# Patient Record
Sex: Female | Born: 1955 | Race: Black or African American | Hispanic: No | Marital: Married | Smoking: Never smoker
Health system: Southern US, Community
[De-identification: ages and names within clinical notes are randomized; demographics above are authoritative.]

## PROBLEM LIST (undated history)

## (undated) DIAGNOSIS — H269 Unspecified cataract: Secondary | ICD-10-CM

## (undated) DIAGNOSIS — G5620 Lesion of ulnar nerve, unspecified upper limb: Secondary | ICD-10-CM

## (undated) DIAGNOSIS — G5600 Carpal tunnel syndrome, unspecified upper limb: Secondary | ICD-10-CM

## (undated) DIAGNOSIS — E1143 Type 2 diabetes mellitus with diabetic autonomic (poly)neuropathy: Secondary | ICD-10-CM

## (undated) DIAGNOSIS — E1149 Type 2 diabetes mellitus with other diabetic neurological complication: Secondary | ICD-10-CM

## (undated) DIAGNOSIS — E559 Vitamin D deficiency, unspecified: Secondary | ICD-10-CM

## (undated) DIAGNOSIS — B029 Zoster without complications: Secondary | ICD-10-CM

## (undated) DIAGNOSIS — E039 Hypothyroidism, unspecified: Secondary | ICD-10-CM

## (undated) HISTORY — DX: Unspecified cataract: H26.9

## (undated) HISTORY — DX: Zoster without complications: B02.9

## (undated) HISTORY — DX: Type 2 diabetes mellitus with diabetic autonomic (poly)neuropathy: E11.43

## (undated) HISTORY — DX: Lesion of ulnar nerve, unspecified upper limb: G56.20

## (undated) HISTORY — DX: Vitamin D deficiency, unspecified: E55.9

## (undated) HISTORY — DX: Type 2 diabetes mellitus with other diabetic neurological complication: E11.49

## (undated) HISTORY — DX: Hypothyroidism, unspecified: E03.9

## (undated) HISTORY — PX: TONSILLECTOMY: SUR1361

## (undated) HISTORY — DX: Carpal tunnel syndrome, unspecified upper limb: G56.00

---

## 2011-01-06 LAB — CBC AND DIFFERENTIAL
HEMATOCRIT: 42 % (ref 36–46)
HEMOGLOBIN: 13.9 g/dL (ref 12.0–16.0)
PLATELETS: 272 10*3/uL (ref 150–399)
WBC: 4.2 10^3/mL

## 2011-01-06 LAB — LIPID PANEL
Cholesterol: 206 mg/dL — AB (ref 0–200)
HDL: 77 mg/dL — AB (ref 35–70)
LDL CALC: 111 mg/dL
Triglycerides: 90 mg/dL (ref 40–160)

## 2011-01-06 LAB — HEMOGLOBIN A1C: Hgb A1c MFr Bld: 6.1 % — AB (ref 4.0–6.0)

## 2011-01-06 LAB — TSH: TSH: 1.37 u[IU]/mL (ref <=5.90)

## 2015-01-05 ENCOUNTER — Other Ambulatory Visit: Payer: Self-pay

## 2015-01-05 DIAGNOSIS — Z1231 Encounter for screening mammogram for malignant neoplasm of breast: Secondary | ICD-10-CM

## 2015-01-11 ENCOUNTER — Ambulatory Visit
Admission: RE | Admit: 2015-01-11 | Discharge: 2015-01-11 | Disposition: A | Discharge status: Home / Self Care | Payer: Federal, State, Local not specified - PPO | Source: Ambulatory Visit

## 2015-01-11 DIAGNOSIS — Z1231 Encounter for screening mammogram for malignant neoplasm of breast: Secondary | ICD-10-CM

## 2015-01-30 LAB — HEPATIC FUNCTION PANEL
ALK PHOS: 56 U/L (ref 25–125)
ALT: 15 U/L (ref 7–35)
AST: 19 U/L (ref 13–35)
Bilirubin, Total: 0.6 mg/dL

## 2015-01-30 LAB — TSH: TSH: 1.12 u[IU]/mL (ref 0.41–5.90)

## 2015-01-30 LAB — CBC AND DIFFERENTIAL
HCT: 41 % (ref 36–46)
HEMOGLOBIN: 13.1 g/dL (ref 12.0–16.0)
Neutrophils Absolute: 2582 /uL
Platelets: 279 10*3/uL (ref 150–399)
WBC: 4.9 10^3/mL

## 2015-01-30 LAB — HEMOGLOBIN A1C: Hgb A1c MFr Bld: 6.2 % — AB (ref 4.0–6.0)

## 2015-01-30 LAB — LIPID PANEL
Cholesterol: 228 mg/dL — AB (ref 0–200)
HDL: 74 mg/dL — AB (ref 35–70)
LDL Cholesterol: 129 mg/dL
Triglycerides: 124 mg/dL (ref 40–160)

## 2015-02-15 ENCOUNTER — Ambulatory Visit: Payer: Self-pay | Admitting: Internal Medicine

## 2015-05-07 ENCOUNTER — Encounter: Payer: Self-pay | Admitting: *Deleted

## 2015-05-07 ENCOUNTER — Encounter: Payer: Self-pay | Admitting: Internal Medicine

## 2015-05-07 ENCOUNTER — Ambulatory Visit (INDEPENDENT_AMBULATORY_CARE_PROVIDER_SITE_OTHER): Payer: Federal, State, Local not specified - PPO | Admitting: Internal Medicine

## 2015-05-07 ENCOUNTER — Other Ambulatory Visit: Payer: Self-pay | Admitting: *Deleted

## 2015-05-07 VITALS — BP 110/80 | HR 77 | Temp 98.5°F | Resp 18 | Ht 66.14 in | Wt 153.2 lb

## 2015-05-07 DIAGNOSIS — E1149 Type 2 diabetes mellitus with other diabetic neurological complication: Secondary | ICD-10-CM | POA: Diagnosis not present

## 2015-05-07 DIAGNOSIS — G629 Polyneuropathy, unspecified: Secondary | ICD-10-CM

## 2015-05-07 DIAGNOSIS — E1142 Type 2 diabetes mellitus with diabetic polyneuropathy: Secondary | ICD-10-CM | POA: Diagnosis not present

## 2015-05-07 DIAGNOSIS — R311 Benign essential microscopic hematuria: Secondary | ICD-10-CM

## 2015-05-07 DIAGNOSIS — Z8639 Personal history of other endocrine, nutritional and metabolic disease: Secondary | ICD-10-CM | POA: Diagnosis not present

## 2015-05-07 DIAGNOSIS — H269 Unspecified cataract: Secondary | ICD-10-CM | POA: Diagnosis not present

## 2015-05-07 DIAGNOSIS — D1802 Hemangioma of intracranial structures: Secondary | ICD-10-CM | POA: Diagnosis not present

## 2015-05-07 DIAGNOSIS — Z1211 Encounter for screening for malignant neoplasm of colon: Secondary | ICD-10-CM | POA: Diagnosis not present

## 2015-05-07 NOTE — Progress Notes (Signed)
Patient ID: Caitlin Hunt, female   DOB: 31-Jan-1956, 59 y.o.   MRN: [id number]   Location:  [facility] / [facility]  Code Status: full code Goals of Care: Advanced Directive information Does patient have an advance directive?: No, Would patient like information on creating an advanced directive?: Yes - Educational materials given   Chief Complaint  Patient presents with  . Establish Care    new patient  . Medical Management of Chronic Issues    HPI: Patient is a 59 y.o. black female seen in the office today to establish with the practice and for medical management of her chronic diseases.  Her mother is my patient, as well and this is how she found out about [place].  She was seeing Dr. Rainey Pines.  She did not have a PCP in [place].    Has a fear of losing her toes b/c of her neuropathy.  Has had numbness for a couple of years, but not painful until recently.  Feels like this is worsening.  She is trying to control her DM with diet.  Has lost weight.  Doesn't want to be in a wheelchair or lose toes.  Her father died in a diabetic coma 9 years ago.  Pain is manageable when she doesn't overdo it.  When travels 7 hrs one way into [place] weekly.  Has gotten better when she does not travel.  Gabapentin works.  Is doing foot exercises doing alphabet and that is helping.  Also takes her omega 3 and vitamin D.  Does not check CBGs.  Was diagnosed approximately 3 years ago.  Got diagnosed with the neuropathy at the same time.  Had last labs 4 mos ago.  Also has a patch for pain.  Last eye appt was 8 mos ago in [place].    Records from neurology reviewed which showed h/o ulnar neuropathy and carpal tunnel syndrome, as well.    Had shingles 6 wks after she got married.  No postherpetic neuralgia in chest area, but it was very painful.  Hypothyroidism has been normal for the past 10 years.  Had hematuria at one time, nonpainful, has been microscopic.  Years ago she had headaches,  had MRI.  Was 2009 with venous angioma present.  No longer has the headaches.  Had her mammogram 4 mos. ago. Has never had cscope and needs that. Has never had a flu shot. Says she only had the flu once.   Has a personal preference against vaccines. Has never had pneumovax or prevnar Does not know when she had tdap last.   Has been taking care of her mother for 5 years.   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  She thinks she does have depression.  Her mother has it.  Only her mother saw psychiatry.  Being the anti-pill people, she does not want cymbalta and other meds.  Lives with it through her faith.    Review of Systems:  Review of Systems  Constitutional: Positive for weight loss.  HENT: Negative for hearing loss.        Partial plate  Eyes:       Corrective lenses; eyes getting worse each year--has a cataract that is not ripe; cannot see well at night  Respiratory: Negative for shortness of breath.   Cardiovascular: Positive for palpitations. Negative for chest pain.       When takes mvi  Gastrointestinal: Positive for constipation.       Eats a lot of salad--admits not  enough water as she should  Genitourinary: Positive for hematuria.       Microscopic  Musculoskeletal: Negative for myalgias, joint pain and falls.  Neurological: Positive for tingling and sensory change. Negative for headaches.  Endo/Heme/Allergies:       Diabetes  Psychiatric/Behavioral: Positive for depression. Negative for memory loss. The patient is nervous/anxious and has insomnia.        Stress; wakes up in the middle of the night at times--is better than 2-3 yrs ago--attributed to menopause; says she forgets more now and writes things down    Past Medical History  Diagnosis Date  . DM (diabetes mellitus) type II controlled, neurological manifestation   . Peripheral autonomic neuropathy due to diabetes mellitus   . Hypothyroidism   . Herpes zoster     chest area  . Vitamin D deficiency   . Carpal tunnel syndrome     . Ulnar neuropathy     Past Surgical History  Procedure Laterality Date  . Tonsillectomy     History   Social History  . Marital Status: Married    Spouse Name: N/A  . Number of Children: N/A  . Years of Education: N/A   Occupational History  . Not on file.   Social History Main Topics  . Smoking status: Never Smoker   . Smokeless tobacco: Not on file  . Alcohol Use: 1.2 oz/week    2 Standard drinks or equivalent per week     Comment: wine  . Drug Use: No  . Sexual Activity: Not on file   Other Topics Concern  . Not on file   Social History Narrative   Diet- Low calorie   Caffeine-No    Married- yes, 2007   House- N/A, 2 persons in home   Pets- 2 dogs   Current/Past profession- Tourist information centre manager   Exercise- daily, 45 min   Living Will-No   DNR-No    POA/HPOA- No          Family History  Problem Relation Age of Onset  . Diabetes Father   . Depression Mother   . Heart disease Mother   . Neuropathy Brother   . Neuropathy Son   . High blood pressure Brother   . Heart attack    . Stroke Mother   . Arthritis Mother   . Osteoporosis Mother    Allergies  Allergen Reactions  . Codeine Nausea And Vomiting  . Other     Allergy to mussels   Medications: Patient's Medications  New Prescriptions   No medications on file  Previous Medications   GABAPENTIN (NEURONTIN) 300 MG CAPSULE    Take 300 mg by mouth 2 (two) times daily.   NONFORMULARY OR COMPOUNDED ITEM    Apply topically.   OMEGA-3 FATTY ACIDS (OMEGA 3 PO)    Take 2 capsules by mouth daily.    PYRIDOXINE (VITAMIN B-6) 100 MG TABLET    Take 100 mg by mouth daily.   VITAMIN D, ERGOCALCIFEROL, (DRISDOL) 50000 UNITS CAPS CAPSULE    Take 50,000 Units by mouth 2 (two) times a week. For 90 days  Modified Medications   No medications on file  Discontinued Medications   No medications on file    Physical Exam: Filed Vitals:   05/07/15 0900  BP: 110/80  Pulse: 77  Temp: 98.5 F  (36.9 C)  TempSrc: Oral  Resp: 18  Height: 5' 6.14" (1.68 m)  Weight: 153 lb 3.2 oz (69.491 kg)  SpO2:  97%   Physical Exam  Constitutional: She is oriented to person, place, and time. She appears well-developed and well-nourished. No distress.  Cardiovascular: Normal rate, regular rhythm, normal heart sounds and intact distal pulses.   Pulmonary/Chest: Effort normal and breath sounds normal.  Abdominal: Bowel sounds are normal.  Musculoskeletal: Normal range of motion.  Neurological: She is alert and oriented to person, place, and time.  Skin: Skin is warm and dry.  Psychiatric: She has a normal mood and affect. Her behavior is normal. Judgment and thought content normal.   Labs reviewed: 01/09/11 labs--will get abstracted.    Procedures reviewed:   Mammogram 01/11/15 NCV and EMG 02/08/15 MRI brain 08/25/08:  Moderate sized venous angioma of right frontal lobe  Assessment/Plan 1. DM (diabetes mellitus) type II controlled, neurological manifestation - tells me her glucose average has been recently well controlled -await records from PCP that have been requested  - Ambulatory referral to Ophthalmology to establish with [facility] here -needs routine labs--last she says were 4 mos ago - CBC with Differential/Platelet - Comprehensive metabolic panel - Hemoglobin A1c - Lipid panel - Microalbumin/Creatinine Ratio, Urine -check foot exam next time  2. Diabetic peripheral neuropathy associated with type 2 diabetes mellitus - cont diet and exercise (45 mins per day) -cont gabapentin and compounded topical, supplements - Microalbumin/Creatinine Ratio, Urine  3. Benign microscopic hematuria -historical per pt -requests recheck - CBC with Differential/Platelet - Urinalysis with Reflex Microscopic  4. Venous angioma of brain -seen on MRI in 2009 when she was having headaches -she would like this reassessed in the near future which I don't think is a bad idea--will plan to order at  her next appt - CBC with Differential/Platelet  5. History of hypothyroidism - has had normal tsh, free t3, free t4 for several years by history - TSH  6. Cataract - is not yet ripe she was told -refer to Dr. Gershon Crane, et al for evaluation and treatment and diabetic eye exam - Ambulatory referral to Ophthalmology  7. Colon cancer screening - has never had colonoscopy - Ambulatory referral to Gastroenterology  Records were requested from PCP, as well.  Reviewed neuro records and labs.   Labs/tests ordered:   Orders Placed This Encounter  Procedures  . CBC with Differential/Platelet  . Comprehensive metabolic panel    Order Specific Question:  Has the patient fasted?    Answer:  Yes  . Hemoglobin A1c  . Lipid panel    Order Specific Question:  Has the patient fasted?    Answer:  Yes  . TSH  . Urinalysis with Reflex Microscopic  . Microalbumin/Creatinine Ratio, Urine  . Ambulatory referral to Gastroenterology    Referral Priority:  Routine    Referral Type:  Consultation    Referral Reason:  Specialty Services Required    Number of Visits Requested:  1  . Ambulatory referral to Ophthalmology    Referral Priority:  Routine    Referral Type:  Consultation    Referral Reason:  Specialty Services Required    Requested Specialty:  Ophthalmology    Number of Visits Requested:  1   Next appt:  4 mos for med mgt with diabetic foot exam and plan to order MRI brain  Caitlin Hunt, D.O. [facility] [address], [place] [address] Cell Phone (Mon-Fri 8am-5pm):  [phone] On Call:  [phone] & follow prompts after 5pm & weekends Office Phone:  [phone] Office Fax:  [phone]

## 2015-05-08 LAB — COMPREHENSIVE METABOLIC PANEL
ALT: 23 IU/L (ref 0–32)
AST: 26 IU/L (ref 0–40)
Albumin/Globulin Ratio: 1.8 (ref 1.1–2.5)
Albumin: 4.6 g/dL (ref 3.6–4.8)
Alkaline Phosphatase: 57 IU/L (ref 39–117)
BUN/Creatinine Ratio: 26 (ref 11–26)
BUN: 19 mg/dL (ref 8–27)
Bilirubin Total: 0.8 mg/dL (ref 0.0–1.2)
CO2: 22 mmol/L (ref 18–29)
Calcium: 10 mg/dL (ref 8.7–10.3)
Chloride: 102 mmol/L (ref 97–108)
Creatinine, Ser: 0.72 mg/dL (ref 0.57–1.00)
GFR calc Af Amer: 105 mL/min/{1.73_m2} (ref >59)
GFR calc non Af Amer: 91 mL/min/{1.73_m2} (ref >59)
Globulin, Total: 2.6 g/dL (ref 1.5–4.5)
Glucose: 88 mg/dL (ref 65–99)
Potassium: 4.4 mmol/L (ref 3.5–5.2)
Sodium: 142 mmol/L (ref 134–144)
Total Protein: 7.2 g/dL (ref 6.0–8.5)

## 2015-05-08 LAB — CBC WITH DIFFERENTIAL/PLATELET
Basophils Absolute: 0 10*3/uL (ref 0.0–0.2)
Basos: 0 %
EOS (ABSOLUTE): 0.1 10*3/uL (ref 0.0–0.4)
Eos: 1 %
Hematocrit: 39.9 % (ref 34.0–46.6)
Hemoglobin: 13.2 g/dL (ref 11.1–15.9)
Immature Grans (Abs): 0 10*3/uL (ref 0.0–0.1)
Immature Granulocytes: 0 %
Lymphocytes Absolute: 1.9 10*3/uL (ref 0.7–3.1)
Lymphs: 44 %
MCH: 29.3 pg (ref 26.6–33.0)
MCHC: 33.1 g/dL (ref 31.5–35.7)
MCV: 89 fL (ref 79–97)
Monocytes Absolute: 0.3 10*3/uL (ref 0.1–0.9)
Monocytes: 8 %
Neutrophils Absolute: 2 10*3/uL (ref 1.4–7.0)
Neutrophils: 47 %
Platelets: 293 10*3/uL (ref 150–379)
RBC: 4.51 x10E6/uL (ref 3.77–5.28)
RDW: 15.3 % (ref 12.3–15.4)
WBC: 4.2 10*3/uL (ref 3.4–10.8)

## 2015-05-08 LAB — MICROSCOPIC EXAMINATION
Casts: NONE SEEN /lpf
Epithelial Cells (non renal): >10 /hpf — AB (ref 0–10)

## 2015-05-08 LAB — URINALYSIS, ROUTINE W REFLEX MICROSCOPIC
Bilirubin, UA: NEGATIVE
Glucose, UA: NEGATIVE
Ketones, UA: NEGATIVE
Leukocytes, UA: NEGATIVE
Nitrite, UA: NEGATIVE
Protein, UA: NEGATIVE
Specific Gravity, UA: 1.022 (ref 1.005–1.030)
Urobilinogen, Ur: 1 mg/dL (ref 0.2–1.0)
pH, UA: 5.5 (ref 5.0–7.5)

## 2015-05-08 LAB — LIPID PANEL
Chol/HDL Ratio: 2.3 ratio units (ref 0.0–4.4)
Cholesterol, Total: 209 mg/dL — ABNORMAL HIGH (ref 100–199)
HDL: 90 mg/dL (ref >39)
LDL Calculated: 107 mg/dL — ABNORMAL HIGH (ref 0–99)
Triglycerides: 59 mg/dL (ref 0–149)
VLDL Cholesterol Cal: 12 mg/dL (ref 5–40)

## 2015-05-08 LAB — HEMOGLOBIN A1C
Est. average glucose Bld gHb Est-mCnc: 126 mg/dL
Hgb A1c MFr Bld: 6 % — ABNORMAL HIGH (ref 4.8–5.6)

## 2015-05-08 LAB — MICROALBUMIN / CREATININE URINE RATIO
Creatinine, Urine: 137.7 mg/dL
MICROALB/CREAT RATIO: 2.3 mg/g creat (ref 0.0–30.0)
Microalbumin, Urine: 3.2 ug/mL

## 2015-05-08 LAB — TSH: TSH: 0.983 u[IU]/mL (ref 0.450–4.500)

## 2015-05-09 ENCOUNTER — Encounter: Payer: Self-pay | Admitting: Internal Medicine

## 2015-05-14 ENCOUNTER — Telehealth: Payer: Self-pay

## 2015-05-14 NOTE — Telephone Encounter (Signed)
Received phone call from Dr. Ronal Fear , Neurologist office in [place]. Phone [phone]; fax [phone]. Patient is there today told them that Dr. Mariea Clonts did lab work on 05/07/15 could we fax copy to them. They will put Dr. Mariea Clonts down as her PCP. Printed labs and faxed.

## 2015-05-17 ENCOUNTER — Other Ambulatory Visit: Payer: Federal, State, Local not specified - PPO

## 2015-05-17 ENCOUNTER — Other Ambulatory Visit: Payer: Self-pay

## 2015-05-17 DIAGNOSIS — E538 Deficiency of other specified B group vitamins: Secondary | ICD-10-CM

## 2015-05-17 DIAGNOSIS — E559 Vitamin D deficiency, unspecified: Secondary | ICD-10-CM

## 2015-05-18 LAB — VITAMIN B12: Vitamin B-12: 596 pg/mL (ref 211–946)

## 2015-05-18 LAB — VITAMIN D 25 HYDROXY (VIT D DEFICIENCY, FRACTURES): Vit D, 25-Hydroxy: 36.8 ng/mL (ref 30.0–100.0)

## 2015-05-28 ENCOUNTER — Encounter: Payer: Self-pay | Admitting: Gastroenterology

## 2015-05-31 ENCOUNTER — Telehealth: Payer: Self-pay

## 2015-05-31 NOTE — Telephone Encounter (Signed)
That's awful.  I recommend she monitor her stools for blood and pay attention to any new abdominal pain she may develop.  If she has any new pain or blood, she should go to the ED for it.  It would take anywhere from 1-3 days typically to pass something.

## 2015-05-31 NOTE — Telephone Encounter (Signed)
Spoke with patient, patient again confirmed no visible blood. Patient denied abdominal pain as well. Patient with diarrhea but other than that doing ok. This incident occurred 2 days ago. Patient verbalized understanding of Dr.Reed's instructions/recommendations

## 2015-05-31 NOTE — Telephone Encounter (Signed)
Message left on triage voicemail- patient states she ate a piece of glass in a salad and would like to know if Dr.Reed would like for her to have her stool examined. Patient collected a bowel movement just in case. Patient with cur in her mouth from eating glass. Patient has not noticed any visible blood in stool.  Please advise

## 2015-06-26 ENCOUNTER — Encounter: Payer: Self-pay | Admitting: Internal Medicine

## 2015-07-27 ENCOUNTER — Ambulatory Visit (AMBULATORY_SURGERY_CENTER): Payer: Self-pay

## 2015-07-27 VITALS — Ht 67.0 in | Wt 159.0 lb

## 2015-07-27 DIAGNOSIS — Z1211 Encounter for screening for malignant neoplasm of colon: Secondary | ICD-10-CM

## 2015-07-27 MED ORDER — NA SULFATE-K SULFATE-MG SULF 17.5-3.13-1.6 GM/177ML PO SOLN: 1.0000 | Freq: Once | ORAL | Status: DC

## 2015-07-27 NOTE — Progress Notes (Signed)
No egg or soy allergies Not on home 02 No previous anesthesia complications Emmi video declined. No diet or weight loss meds 

## 2015-08-02 ENCOUNTER — Encounter: Payer: Self-pay | Admitting: Gastroenterology

## 2015-08-02 ENCOUNTER — Ambulatory Visit (AMBULATORY_SURGERY_CENTER): Payer: Federal, State, Local not specified - PPO | Admitting: Gastroenterology

## 2015-08-02 VITALS — BP 101/62 | HR 90 | Temp 97.7°F | Resp 16 | Ht 67.0 in | Wt 159.0 lb

## 2015-08-02 DIAGNOSIS — D123 Benign neoplasm of transverse colon: Secondary | ICD-10-CM

## 2015-08-02 DIAGNOSIS — Z1211 Encounter for screening for malignant neoplasm of colon: Secondary | ICD-10-CM

## 2015-08-02 DIAGNOSIS — D122 Benign neoplasm of ascending colon: Secondary | ICD-10-CM

## 2015-08-02 DIAGNOSIS — K635 Polyp of colon: Secondary | ICD-10-CM | POA: Diagnosis not present

## 2015-08-02 MED ORDER — SODIUM CHLORIDE 0.9 % IV SOLN: 500.0000 mL | INTRAVENOUS | Status: DC

## 2015-08-02 NOTE — Progress Notes (Signed)
Called to room to assist during endoscopic procedure.  Patient ID and intended procedure confirmed with present staff. Received instructions for my participation in the procedure from the performing physician.  

## 2015-08-02 NOTE — Patient Instructions (Signed)
Impressions/recommendations:  Polyps (handout given) Diverticulosis (handout given) High Fiber Diet (handout given)  Repeat colonoscopy pending pathology results.  YOU HAD AN ENDOSCOPIC PROCEDURE TODAY AT [facility]:   Refer to the procedure report that was given to you for any specific questions about what was found during the examination.  If the procedure report does not answer your questions, please call your gastroenterologist to clarify.  If you requested that your care partner not be given the details of your procedure findings, then the procedure report has been included in a sealed envelope for you to review at your convenience later.  YOU SHOULD EXPECT: Some feelings of bloating in the abdomen. Passage of more gas than usual.  Walking can help get rid of the air that was put into your GI tract during the procedure and reduce the bloating. If you had a lower endoscopy (such as a colonoscopy or flexible sigmoidoscopy) you may notice spotting of blood in your stool or on the toilet paper. If you underwent a bowel prep for your procedure, you may not have a normal bowel movement for a few days.  Please Note:  You might notice some irritation and congestion in your nose or some drainage.  This is from the oxygen used during your procedure.  There is no need for concern and it should clear up in a day or so.  SYMPTOMS TO REPORT IMMEDIATELY:   Following lower endoscopy (colonoscopy or flexible sigmoidoscopy):  Excessive amounts of blood in the stool  Significant tenderness or worsening of abdominal pains  Swelling of the abdomen that is new, acute  Fever of 100F or higher  For urgent or emergent issues, a gastroenterologist can be reached at any hour by calling [phone].   DIET: Your first meal following the procedure should be a small meal and then it is ok to progress to your normal diet. Heavy or fried foods are harder to digest and may make you feel nauseous  or bloated.  Likewise, meals heavy in dairy and vegetables can increase bloating.  Drink plenty of fluids but you should avoid alcoholic beverages for 24 hours.  ACTIVITY:  You should plan to take it easy for the rest of today and you should NOT DRIVE or use heavy machinery until tomorrow (because of the sedation medicines used during the test).    FOLLOW UP: Our staff will call the number listed on your records the next business day following your procedure to check on you and address any questions or concerns that you may have regarding the information given to you following your procedure. If we do not reach you, we will leave a message.  However, if you are feeling well and you are not experiencing any problems, there is no need to return our call.  We will assume that you have returned to your regular daily activities without incident.  If any biopsies were taken you will be contacted by phone or by letter within the next 1-3 weeks.  Please call us at [phone] if you have not heard about the biopsies in 3 weeks.    SIGNATURES/CONFIDENTIALITY: You and/or your care partner have signed paperwork which will be entered into your electronic medical record.  These signatures attest to the fact that that the information above on your After Visit Summary has been reviewed and is understood.  Full responsibility of the confidentiality of this discharge information lies with you and/or your care-partner.

## 2015-08-02 NOTE — Progress Notes (Signed)
Pt awake and alert, vss, report to RN

## 2015-08-02 NOTE — Op Note (Signed)
[facility]  [address] [place], [id number]   COLONOSCOPY PROCEDURE REPORT  PATIENT: Caitlin Hunt, Caitlin Hunt  MR#: [id number] BIRTHDATE: June 03, 1956 , 59  yrs. old GENDER: female ENDOSCOPIST: Yetta Flock, MD REFERRED BY: Hollace Kinnier MD PROCEDURE DATE:  08/02/2015 PROCEDURE:   Colonoscopy, screening and Colonoscopy with biopsy First Screening Colonoscopy - Avg.  risk and is 50 yrs.  old or older Yes.  Prior Negative Screening - Now for repeat screening. N/A  History of Adenoma - Now for follow-up colonoscopy & has been > or = to 3 yrs.  N/A  Polyps removed today? Yes ASA CLASS:   Class II INDICATIONS:Screening for colonic neoplasia and Colorectal Neoplasm Risk Assessment for this procedure is average risk. MEDICATIONS: Propofol 350 mg IV  DESCRIPTION OF PROCEDURE:   After the risks benefits and alternatives of the procedure were thoroughly explained, informed consent was obtained.  The digital rectal exam revealed no abnormalities of the rectum.   The LB [id number]  endoscope was introduced through the anus and advanced to the cecum, which was identified by both the appendix and ileocecal valve. No adverse events experienced.   The quality of the prep was adequate  The instrument was then slowly withdrawn as the colon was fully examined. Estimated blood loss is zero unless otherwise noted in this procedure report.  COLON FINDINGS: A flat polyp measuring 3 mm in size was found in the ascending colon.  A polypectomy was performed with cold forceps. The resection was complete, the polyp tissue was completely retrieved and sent to histology.   A sessile polyp measuring 3 mm in size was found at the hepatic flexure.  A polypectomy was performed with cold forceps.  The resection was complete, the polyp tissue was completely retrieved and sent to histology.   There was mild diverticulosis noted in the sigmoid colon.   The examination was otherwise normal.   Retroflexion was not performed due to a narrow rectal vault, but the rectum appeared normal. The time to cecum = 6.9 Withdrawal time = 11.4   The scope was withdrawn and the procedure completed. COMPLICATIONS: There were no immediate complications.  ENDOSCOPIC IMPRESSION: 1.   Flat polyp was found in the ascending colon; polypectomy was performed with cold forceps 2.   Sessile polyp was found at the hepatic flexure; polypectomy was performed with cold forceps 3.   Mild diverticulosis was noted in the sigmoid colon 4.   The examination was otherwise normal  RECOMMENDATIONS: Resume diet Resume medications Await pathology results  eSigned:  Yetta Flock, MD 08/02/2015 10:11 AM   cc: Hollace Kinnier MD, the patient

## 2015-08-03 ENCOUNTER — Telehealth: Payer: Self-pay

## 2015-08-03 NOTE — Telephone Encounter (Signed)
Left message on answering machine. 

## 2015-08-08 ENCOUNTER — Encounter: Payer: Self-pay | Admitting: Gastroenterology

## 2015-08-23 ENCOUNTER — Encounter: Payer: Self-pay | Admitting: Internal Medicine

## 2015-09-03 ENCOUNTER — Other Ambulatory Visit: Payer: Federal, State, Local not specified - PPO

## 2015-09-04 ENCOUNTER — Other Ambulatory Visit: Payer: Federal, State, Local not specified - PPO

## 2015-09-06 ENCOUNTER — Ambulatory Visit: Payer: Federal, State, Local not specified - PPO | Admitting: Internal Medicine

## 2015-09-13 ENCOUNTER — Other Ambulatory Visit: Payer: Federal, State, Local not specified - PPO

## 2015-09-14 ENCOUNTER — Other Ambulatory Visit: Payer: Federal, State, Local not specified - PPO

## 2015-09-14 DIAGNOSIS — E1165 Type 2 diabetes mellitus with hyperglycemia: Principal | ICD-10-CM

## 2015-09-14 DIAGNOSIS — E039 Hypothyroidism, unspecified: Secondary | ICD-10-CM

## 2015-09-14 DIAGNOSIS — E785 Hyperlipidemia, unspecified: Secondary | ICD-10-CM

## 2015-09-14 DIAGNOSIS — IMO0001 Reserved for inherently not codable concepts without codable children: Secondary | ICD-10-CM

## 2015-09-15 DIAGNOSIS — G5600 Carpal tunnel syndrome, unspecified upper limb: Secondary | ICD-10-CM | POA: Insufficient documentation

## 2015-09-15 DIAGNOSIS — E1143 Type 2 diabetes mellitus with diabetic autonomic (poly)neuropathy: Secondary | ICD-10-CM | POA: Insufficient documentation

## 2015-09-15 DIAGNOSIS — E1149 Type 2 diabetes mellitus with other diabetic neurological complication: Secondary | ICD-10-CM | POA: Insufficient documentation

## 2015-09-15 DIAGNOSIS — E039 Hypothyroidism, unspecified: Secondary | ICD-10-CM | POA: Insufficient documentation

## 2015-09-15 DIAGNOSIS — G5620 Lesion of ulnar nerve, unspecified upper limb: Secondary | ICD-10-CM | POA: Insufficient documentation

## 2015-09-15 LAB — URINALYSIS, ROUTINE W REFLEX MICROSCOPIC
Bilirubin, UA: NEGATIVE
Glucose, UA: NEGATIVE
Ketones, UA: NEGATIVE
Leukocytes, UA: NEGATIVE
Nitrite, UA: NEGATIVE
Protein, UA: NEGATIVE
Specific Gravity, UA: 1.024 (ref 1.005–1.030)
Urobilinogen, Ur: 1 mg/dL (ref 0.2–1.0)
pH, UA: 7 (ref 5.0–7.5)

## 2015-09-15 LAB — CBC WITH DIFFERENTIAL/PLATELET
Basophils Absolute: 0 10*3/uL (ref 0.0–0.2)
Basos: 0 %
EOS (ABSOLUTE): 0.1 10*3/uL (ref 0.0–0.4)
Eos: 1 %
Hematocrit: 38.3 % (ref 34.0–46.6)
Hemoglobin: 12.6 g/dL (ref 11.1–15.9)
Immature Grans (Abs): 0 10*3/uL (ref 0.0–0.1)
Immature Granulocytes: 0 %
Lymphocytes Absolute: 2 10*3/uL (ref 0.7–3.1)
Lymphs: 44 %
MCH: 29.5 pg (ref 26.6–33.0)
MCHC: 32.9 g/dL (ref 31.5–35.7)
MCV: 90 fL (ref 79–97)
Monocytes Absolute: 0.3 10*3/uL (ref 0.1–0.9)
Monocytes: 7 %
Neutrophils Absolute: 2.2 10*3/uL (ref 1.4–7.0)
Neutrophils: 48 %
Platelets: 300 10*3/uL (ref 150–379)
RBC: 4.27 x10E6/uL (ref 3.77–5.28)
RDW: 15.1 % (ref 12.3–15.4)
WBC: 4.6 10*3/uL (ref 3.4–10.8)

## 2015-09-15 LAB — BASIC METABOLIC PANEL
BUN/Creatinine Ratio: 29 — ABNORMAL HIGH (ref 11–26)
BUN: 17 mg/dL (ref 8–27)
CO2: 24 mmol/L (ref 18–29)
Calcium: 9.8 mg/dL (ref 8.7–10.3)
Chloride: 101 mmol/L (ref 97–106)
Creatinine, Ser: 0.59 mg/dL (ref 0.57–1.00)
GFR calc Af Amer: 115 mL/min/{1.73_m2} (ref >59)
GFR calc non Af Amer: 100 mL/min/{1.73_m2} (ref >59)
Glucose: 82 mg/dL (ref 65–99)
Potassium: 4.5 mmol/L (ref 3.5–5.2)
Sodium: 141 mmol/L (ref 136–144)

## 2015-09-15 LAB — TSH: TSH: 0.801 u[IU]/mL (ref 0.450–4.500)

## 2015-09-15 LAB — LIPID PANEL
Chol/HDL Ratio: 2.3 ratio units (ref 0.0–4.4)
Cholesterol, Total: 222 mg/dL — ABNORMAL HIGH (ref 100–199)
HDL: 96 mg/dL (ref >39)
LDL Calculated: 116 mg/dL — ABNORMAL HIGH (ref 0–99)
Triglycerides: 52 mg/dL (ref 0–149)
VLDL Cholesterol Cal: 10 mg/dL (ref 5–40)

## 2015-09-15 LAB — MICROSCOPIC EXAMINATION: Casts: NONE SEEN /lpf

## 2015-09-15 LAB — HEMOGLOBIN A1C
Est. average glucose Bld gHb Est-mCnc: 123 mg/dL
Hgb A1c MFr Bld: 5.9 % — ABNORMAL HIGH (ref 4.8–5.6)

## 2015-09-17 ENCOUNTER — Ambulatory Visit: Payer: Federal, State, Local not specified - PPO | Admitting: Internal Medicine

## 2015-09-17 ENCOUNTER — Encounter: Payer: Self-pay | Admitting: Internal Medicine

## 2015-09-24 ENCOUNTER — Ambulatory Visit (INDEPENDENT_AMBULATORY_CARE_PROVIDER_SITE_OTHER): Payer: Federal, State, Local not specified - PPO | Admitting: Internal Medicine

## 2015-09-24 ENCOUNTER — Encounter: Payer: Self-pay | Admitting: Internal Medicine

## 2015-09-24 VITALS — BP 122/82 | HR 85 | Temp 98.4°F | Ht 67.0 in | Wt 154.0 lb

## 2015-09-24 DIAGNOSIS — E1142 Type 2 diabetes mellitus with diabetic polyneuropathy: Secondary | ICD-10-CM | POA: Diagnosis not present

## 2015-09-24 DIAGNOSIS — R3121 Asymptomatic microscopic hematuria: Secondary | ICD-10-CM

## 2015-09-24 DIAGNOSIS — E039 Hypothyroidism, unspecified: Secondary | ICD-10-CM | POA: Diagnosis not present

## 2015-09-24 DIAGNOSIS — H269 Unspecified cataract: Secondary | ICD-10-CM | POA: Diagnosis not present

## 2015-09-24 NOTE — Patient Instructions (Signed)
Please see the urologist to confirm that your microscopic blood in the urine is not of concern.  Also please schedule an eye appt.

## 2015-09-24 NOTE — Progress Notes (Signed)
Patient ID: Caitlin Hunt, female   DOB: 1956-03-28, 58 y.o.   MRN: [id number]   Location: [place] Provider: Rexene Edison. Mariea Clonts, D.O., C.M.D.  Goals of Care: Advanced Directive information Does patient have an advance directive?: No  Chief Complaint  Patient presents with  . Medical Management of Chronic Issues    medication management, blood sugar, check feet.    HPI: Patient is a 59 y.o. female seen in the office today for medical mgt of chronic diseases.  Has very very dry skin.  Otherwise nothing new.  Is drinking more water to help with constipation.  If she doesn't, it makes a difference.  Is eating more fiber.  Needs to schedule her eye appt.  Had cscope.  Needs repeat in 5 years for polyp.    Continues with mild hematuria.  Has had this for 42 years.  Was advised it was normal.  Her brother also had it and her mother does also.  hgb also slightly down since last check.    DMII;  Sugar average improved to 5.9 from 6.  Does not check at home.  Watches what she eats.  Weight down 5 lbs since last time.    Vitamin D has been low.  B12/B6/methylfolate supplement started a month ago.  Still having lots of nerve pain in her feet.  Less when she is not traveling and active.  The longer she's been at home and the more days at home, the better it gets.  Also had a 7 hr commute by train.  Exercises every other day.  Does not take gabapentin and copes with pain.    Review of Systems:  Review of Systems  Constitutional: Negative for fever, chills and malaise/fatigue.  HENT: Negative for congestion and hearing loss.   Eyes: Negative for blurred vision.       Glasses  Respiratory: Negative for shortness of breath.   Cardiovascular: Negative for chest pain and leg swelling.  Gastrointestinal: Negative for abdominal pain.  Genitourinary: Positive for hematuria. Negative for dysuria, urgency and frequency.       Referral placed for hematuria on labs  Musculoskeletal: Negative  for falls.  Skin: Negative for rash.  Neurological: Positive for tingling and sensory change. Negative for dizziness, loss of consciousness and weakness.  Psychiatric/Behavioral: Negative for depression and memory loss. The patient is not nervous/anxious and does not have insomnia.     Past Medical History  Diagnosis Date  . Peripheral autonomic neuropathy due to diabetes mellitus ([place])   . Hypothyroidism   . Herpes zoster     chest area  . Vitamin D deficiency   . Carpal tunnel syndrome   . Ulnar neuropathy   . Cataract   . DM (diabetes mellitus) type II controlled, neurological manifestation (HCC)     diet controlled, no meds    Past Surgical History  Procedure Laterality Date  . Tonsillectomy      Allergies  Allergen Reactions  . Codeine Nausea And Vomiting  . Other     Allergy to mussels      Medication List       This list is accurate as of: 09/24/15  3:55 PM.  Always use your most recent med list.               METANX PO  Take by mouth. 60 mg take one twice daily     NONFORMULARY OR COMPOUNDED ITEM  Apply topically.     OMEGA 3 PO  Take  2 capsules by mouth daily.     Vitamin D (Ergocalciferol) 50000 UNITS Caps capsule  Commonly known as:  DRISDOL  Take 50,000 Units by mouth 2 (two) times a week. For 90 days        Health Maintenance  Topic Date Due  . Hepatitis C Screening  06[birth year]  . PNEUMOCOCCAL POLYSACCHARIDE VACCINE (1) 04/29/1957  . FOOT EXAM  04/29/1965  . OPHTHALMOLOGY EXAM  04/29/1965  . HIV Screening  04/29/1970  . TETANUS/TDAP  04/29/1974  . PAP SMEAR  04/29/1976  . ZOSTAVAX  04/30/2015  . INFLUENZA VACCINE  05/07/2015  . HEMOGLOBIN A1C  03/14/2016  . URINE MICROALBUMIN  05/06/2016  . MAMMOGRAM  01/10/2017  . COLONOSCOPY  08/01/2020    Physical Exam: Filed Vitals:   09/24/15 1546  BP: 122/82  Pulse: 85  Temp: 98.4 F (36.9 C)  TempSrc: Oral  Height: 5\' 7"  (1.702 m)  Weight: 154 lb (69.854 kg)  SpO2: 98%   Body  mass index is 24.11 kg/(m^2). Physical Exam  Constitutional: She is oriented to person, place, and time. She appears well-developed and well-nourished. No distress.  Cardiovascular: Normal rate, regular rhythm, normal heart sounds and intact distal pulses.   Pulmonary/Chest: Effort normal and breath sounds normal. No respiratory distress.  Abdominal: Bowel sounds are normal.  Musculoskeletal: Normal range of motion.  Neurological: She is alert and oriented to person, place, and time. She has normal reflexes. No cranial nerve deficit.  See foot exam in encounter  Skin: Skin is warm and dry.  Psychiatric: She has a normal mood and affect.    Labs reviewed: Basic Metabolic Panel:  Recent Labs  01/30/15 05/07/15 1036 09/14/15 1332  NA  --  142 141  K  --  4.4 4.5  CL  --  102 101  CO2  --  22 24  GLUCOSE  --  88 82  BUN  --  19 17  CREATININE  --  0.72 0.59  CALCIUM  --  10.0 9.8  TSH 1.12 0.983 0.801   Liver Function Tests:  Recent Labs  01/30/15 05/07/15 1036  AST 19 26  ALT 15 23  ALKPHOS 56 57  BILITOT  --  0.8  PROT  --  7.2  ALBUMIN  --  4.6   No results for input(s): LIPASE, AMYLASE in the last 8760 hours. No results for input(s): AMMONIA in the last 8760 hours. CBC:  Recent Labs  01/30/15 05/07/15 1036 09/14/15 1332  WBC 4.9 4.2 4.6  NEUTROABS 2582 2.0 2.2  HGB 13.1  --   --   HCT 41 39.9 38.3  PLT 279  --   --    Lipid Panel:  Recent Labs  01/30/15 05/07/15 1036 09/14/15 1332  CHOL 228* 209* 222*  HDL 74* 90 96  LDLCALC 129 107* 116*  TRIG 124 59 52  CHOLHDL  --  2.3 2.3   Lab Results  Component Value Date   HGBA1C 5.9* 09/14/2015    Assessment/Plan 1. Asymptomatic microscopic hematuria - says she's had this evaluated in the past, but I don't have access to any of the records of this and it persists so I would like to confirm that nothing serious is going on - Ambulatory referral to Urology  2. Controlled type 2 diabetes mellitus with  diabetic polyneuropathy, without long-term current use of insulin ([place]) -under great control with diet and exercise, not on ace or statin or asa either - Hemoglobin A1c; Future -neuropathy remains  bothersome, but says gabapentin didn't help her so stopped -cont B complex vitamin  3. Hypothyroidism, unspecified hypothyroidism type - has h/o hypothyroidism, but tsh has been wnl w/o meds - TSH; Future  4. Cataract -f/u with ophthalmologist as planned -ask them to send notes  Labs/tests ordered:   Orders Placed This Encounter  Procedures  . Hemoglobin A1c    Standing Status: Future     Number of Occurrences:      Standing Expiration Date: 03/24/2016  . TSH    Standing Status: Future     Number of Occurrences:      Standing Expiration Date: 03/24/2016  . Ambulatory referral to Urology    Referral Priority:  Routine    Referral Type:  Consultation    Referral Reason:  Specialty Services Required    Requested Specialty:  Urology    Number of Visits Requested:  1    Next appt:  01/24/2016 for annual exam with labs before   [place]. Erline Siddoway, D.O. [facility] [address], [place] [address] Cell Phone (Mon-Fri 8am-5pm):  [phone] On Call:  [phone] & follow prompts after 5pm & weekends Office Phone:  [phone] Office Fax:  [phone]

## 2015-10-29 ENCOUNTER — Telehealth: Payer: Self-pay | Admitting: *Deleted

## 2015-10-29 NOTE — Telephone Encounter (Signed)
Patient called and Left message requesting to speak with Dr. Mariea Clonts. Called patient back and left message to return my call.

## 2015-10-30 NOTE — Telephone Encounter (Signed)
Called patient back and she stated that Caitlin Hunt was taking care of her request.

## 2015-11-29 ENCOUNTER — Encounter: Payer: Self-pay | Admitting: Internal Medicine

## 2016-01-21 ENCOUNTER — Other Ambulatory Visit: Payer: Federal, State, Local not specified - PPO

## 2016-01-24 ENCOUNTER — Ambulatory Visit: Payer: Federal, State, Local not specified - PPO | Admitting: Internal Medicine

## 2016-01-24 ENCOUNTER — Encounter: Payer: Self-pay | Admitting: Internal Medicine

## 2016-01-24 ENCOUNTER — Ambulatory Visit (INDEPENDENT_AMBULATORY_CARE_PROVIDER_SITE_OTHER): Payer: Federal, State, Local not specified - PPO | Admitting: Internal Medicine

## 2016-01-24 VITALS — BP 120/70 | HR 74 | Temp 98.1°F | Ht 67.0 in | Wt 161.0 lb

## 2016-01-24 DIAGNOSIS — Z13220 Encounter for screening for lipoid disorders: Secondary | ICD-10-CM

## 2016-01-24 DIAGNOSIS — R3121 Asymptomatic microscopic hematuria: Secondary | ICD-10-CM | POA: Diagnosis not present

## 2016-01-24 DIAGNOSIS — E039 Hypothyroidism, unspecified: Secondary | ICD-10-CM

## 2016-01-24 DIAGNOSIS — E1142 Type 2 diabetes mellitus with diabetic polyneuropathy: Secondary | ICD-10-CM

## 2016-01-24 NOTE — Progress Notes (Signed)
Patient ID: Caitlin Hunt, female   DOB: [birth year], 60 y.o.   MRN: [id number]   Location:  [facility] Provider:  [place] Lebeau L. Mariea Clonts, D.O., C.M.D.  Code Status: full code Goals of Care:  Advanced Directives 09/24/2015  Does patient have an advance directive? No  Would patient like information on creating an advanced directive? -   Chief Complaint  Patient presents with  . Medical Management of Chronic Issues    4 mth follow-up    HPI: Patient is a 60 y.o. female seen today for medical management of chronic diseases.    Says overload overload with work and taking care of her mom.  Has to take gabapentin more often for her foot pain.  Is taking it once a week now.  Wants to lose 5-6 lbs to help with it.    Cystoscopy:  Tried to get out of it while on the table.  She survived.  At the end, it was painful and when she woul dsit back in her recliner chair, she had pain.  Went back for urinalysis and it was negative for infection.  Just the slightest bit of discomfort left.  Thinks she has the hematuria chronically.  Says son and brother have it.  Wonders if her mom has it.    Ulnar neuropathy periodically.  She continues to work at home and that helps a lot.  Travel and commute aggravate her neuropathy.    Past Medical History  Diagnosis Date  . Peripheral autonomic neuropathy due to diabetes mellitus ([place])   . Hypothyroidism   . Herpes zoster     chest area  . Vitamin D deficiency   . Carpal tunnel syndrome   . Ulnar neuropathy   . Cataract   . DM (diabetes mellitus) type II controlled, neurological manifestation (HCC)     diet controlled, no meds    Past Surgical History  Procedure Laterality Date  . Tonsillectomy      Allergies  Allergen Reactions  . Codeine Nausea And Vomiting  . Other     Allergy to mussels      Medication List       This list is accurate as of: 01/24/16  3:46 PM.  Always use your most recent med list.               gabapentin 100 MG capsule   Commonly known as:  NEURONTIN  PATIENT UNSURE OF DOSE     METANX PO  Take by mouth. 60 mg take one twice daily     OMEGA 3 PO  Take 2 capsules by mouth daily.     Vitamin D (Ergocalciferol) 50000 units Caps capsule  Commonly known as:  DRISDOL  Take 50,000 Units by mouth 2 (two) times a week. For 90 days        Review of Systems:  Review of Systems  Constitutional: Negative for fever, chills and malaise/fatigue.  HENT: Negative for congestion and hearing loss.   Eyes: Negative for blurred vision.       Glasses  Respiratory: Negative for shortness of breath.   Cardiovascular: Negative for chest pain and leg swelling.  Gastrointestinal: Negative for abdominal pain, constipation, blood in stool and melena.  Genitourinary: Positive for hematuria. Negative for dysuria, urgency and frequency.  Musculoskeletal: Negative for falls.  Skin: Negative for rash.  Neurological: Positive for tingling and sensory change. Negative for focal weakness, loss of consciousness, weakness and headaches.  Psychiatric/Behavioral: Negative for depression and memory loss. The patient  is nervous/anxious.     Health Maintenance  Topic Date Due  . Hepatitis C Screening  June 21, 1956  . PNEUMOCOCCAL POLYSACCHARIDE VACCINE (1) 04/29/1957  . OPHTHALMOLOGY EXAM  04/29/1965  . HIV Screening  04/29/1970  . PAP SMEAR  04/29/1976  . ZOSTAVAX  04/30/2015  . TETANUS/TDAP  01/23/2026 (Originally 04/29/1974)  . HEMOGLOBIN A1C  03/14/2016  . INFLUENZA VACCINE  05/06/2016  . URINE MICROALBUMIN  05/06/2016  . FOOT EXAM  09/23/2016  . MAMMOGRAM  01/10/2017  . COLONOSCOPY  08/01/2020    Physical Exam: Filed Vitals:   01/24/16 1533  BP: 120/70  Pulse: 74  Temp: 98.1 F (36.7 C)  TempSrc: Oral  Height: 5\' 7"  (1.702 m)  Weight: 161 lb (73.029 kg)  SpO2: 98%   Body mass index is 25.21 kg/(m^2). Physical Exam  Constitutional: She is oriented to person, place, and time. She appears well-developed and  well-nourished. No distress.  Cardiovascular: Normal rate, regular rhythm, normal heart sounds and intact distal pulses.   Pulmonary/Chest: Effort normal and breath sounds normal. No respiratory distress.  Abdominal: Soft. Bowel sounds are normal. She exhibits no distension. There is no tenderness.  Musculoskeletal: Normal range of motion.  Neurological: She is alert and oriented to person, place, and time.  Skin: Skin is warm and dry.  Psychiatric: She has a normal mood and affect.    Labs reviewed: Basic Metabolic Panel:  Recent Labs  01/30/15 05/07/15 1036 09/14/15 1332  NA  --  142 141  K  --  4.4 4.5  CL  --  102 101  CO2  --  22 24  GLUCOSE  --  88 82  BUN  --  19 17  CREATININE  --  0.72 0.59  CALCIUM  --  10.0 9.8  TSH 1.12 0.983 0.801   Liver Function Tests:  Recent Labs  01/30/15 05/07/15 1036  AST 19 26  ALT 15 23  ALKPHOS 56 57  BILITOT  --  0.8  PROT  --  7.2  ALBUMIN  --  4.6   No results for input(s): LIPASE, AMYLASE in the last 8760 hours. No results for input(s): AMMONIA in the last 8760 hours. CBC:  Recent Labs  01/30/15 05/07/15 1036 09/14/15 1332  WBC 4.9 4.2 4.6  NEUTROABS 2582 2.0 2.2  HGB 13.1  --   --   HCT 41 39.9 38.3  MCV  --  89 90  PLT 279 293 300   Lipid Panel:  Recent Labs  01/30/15 05/07/15 1036 09/14/15 1332  CHOL 228* 209* 222*  HDL 74* 90 96  LDLCALC 129 107* 116*  TRIG 124 59 52  CHOLHDL  --  2.3 2.3   Lab Results  Component Value Date   HGBA1C 5.9* 09/14/2015    Assessment/Plan 1. Asymptomatic microscopic hematuria -cystoscopy was benign, microscopic UAs have been unremarkable for renal etiology outside of a possible stone on urogram -has f/u in 1 year  2. Controlled type 2 diabetes mellitus with diabetic polyneuropathy, without long-term current use of insulin (HCC) - cont to work on diet and exercise, needs to lose her 5-6 lbs again which helped her neuropathy and her labs before - Hemoglobin  A1c  3. Hypothyroidism, unspecified hypothyroidism type - has been clinically stable, await chemical result - TSH  4. Screening, lipid - need to reassess - Lipid panel  Labs/tests ordered:   Orders Placed This Encounter  Procedures  . Lipid panel   Next appt:  05/26/2016 for annual  with pap  Ayesha Markwell L. Janaya Broy, D.O. [facility] [address], [place] [address] Cell Phone (Mon-Fri 8am-5pm):  [phone] On Call:  [phone] & follow prompts after 5pm & weekends Office Phone:  [phone] Office Fax:  [phone]

## 2016-01-25 LAB — HEMOGLOBIN A1C
Est. average glucose Bld gHb Est-mCnc: 131 mg/dL
Hgb A1c MFr Bld: 6.2 % — ABNORMAL HIGH (ref 4.8–5.6)

## 2016-01-25 LAB — LIPID PANEL
Chol/HDL Ratio: 2.5 ratio units (ref 0.0–4.4)
Cholesterol, Total: 230 mg/dL — ABNORMAL HIGH (ref 100–199)
HDL: 93 mg/dL (ref >39)
LDL Calculated: 124 mg/dL — ABNORMAL HIGH (ref 0–99)
Triglycerides: 63 mg/dL (ref 0–149)
VLDL Cholesterol Cal: 13 mg/dL (ref 5–40)

## 2016-01-25 LAB — TSH: TSH: 0.854 u[IU]/mL (ref 0.450–4.500)

## 2016-01-28 ENCOUNTER — Encounter: Payer: Self-pay | Admitting: *Deleted

## 2016-05-26 ENCOUNTER — Encounter: Payer: Federal, State, Local not specified - PPO | Admitting: Internal Medicine

## 2016-06-30 ENCOUNTER — Encounter: Payer: Federal, State, Local not specified - PPO | Admitting: Internal Medicine

## 2016-07-21 ENCOUNTER — Encounter: Payer: Federal, State, Local not specified - PPO | Admitting: Internal Medicine

## 2016-09-11 ENCOUNTER — Encounter: Payer: Federal, State, Local not specified - PPO | Admitting: Internal Medicine

## 2017-01-15 ENCOUNTER — Ambulatory Visit (INDEPENDENT_AMBULATORY_CARE_PROVIDER_SITE_OTHER): Payer: Federal, State, Local not specified - PPO | Admitting: Internal Medicine

## 2017-01-15 ENCOUNTER — Encounter: Payer: Self-pay | Admitting: Internal Medicine

## 2017-01-15 VITALS — BP 120/70 | HR 77 | Temp 97.8°F | Ht 67.0 in | Wt 164.0 lb

## 2017-01-15 DIAGNOSIS — E039 Hypothyroidism, unspecified: Secondary | ICD-10-CM | POA: Diagnosis not present

## 2017-01-15 DIAGNOSIS — Z01419 Encounter for gynecological examination (general) (routine) without abnormal findings: Secondary | ICD-10-CM | POA: Diagnosis not present

## 2017-01-15 DIAGNOSIS — R3129 Other microscopic hematuria: Secondary | ICD-10-CM

## 2017-01-15 DIAGNOSIS — M19112 Post-traumatic osteoarthritis, left shoulder: Secondary | ICD-10-CM | POA: Insufficient documentation

## 2017-01-15 DIAGNOSIS — M25562 Pain in left knee: Secondary | ICD-10-CM

## 2017-01-15 DIAGNOSIS — S46002S Unspecified injury of muscle(s) and tendon(s) of the rotator cuff of left shoulder, sequela: Secondary | ICD-10-CM

## 2017-01-15 DIAGNOSIS — E1142 Type 2 diabetes mellitus with diabetic polyneuropathy: Secondary | ICD-10-CM | POA: Diagnosis not present

## 2017-01-15 DIAGNOSIS — G8929 Other chronic pain: Secondary | ICD-10-CM | POA: Insufficient documentation

## 2017-01-15 DIAGNOSIS — M25561 Pain in right knee: Secondary | ICD-10-CM

## 2017-01-15 NOTE — Progress Notes (Signed)
Provider:  Rexene Edison. Mariea Clonts, D.O., C.M.D. Location:   [facility]   Place of Service:   clinic  Previous PCP: Hollace Kinnier, DO Patient Care Team: Gayland Curry, DO as PCP - General (Geriatric Medicine) Manus Gunning, [name]  Extended Emergency Contact Information Primary Emergency Contact: Waldron Labs [place] Mobile Phone: [phone] Relation: Spouse  Code Status: full code Goals of Care: Advanced Directive information Advanced Directives 09/24/2015  Does Patient Have a Medical Advance Directive? No  Would patient like information on creating a medical advance directive? -   Chief Complaint  Patient presents with  . Annual Exam    CPE with pap    HPI: Patient is a 61 y.o. female seen today for an annual physical exam with pap smear/female exam.    She has gained 3 lbs.  Reports she needs to eat better and walk more.   Tired from a trip to [place]. For a celebratory event re: housing for various races.  Often has her neuropathic pain in her feet.  Uses the gabapentin rarely b/c she's anti-pills.  Is taking her vitamin D, B vitamins and omega3 fish oil.    Does have some new knee pain in the last 3 weeks.  She is walking for exercise--2 miles very other day depending on neuropathy severity.  She requests xrays of her knees though it's pretty certain this is osteoathritis.  She is to f/u with urology re: the microscopic hematuria.  Requests a UA here to reassess.    c/o left shoulder pain with some decreased ROM.  Difficulty reaching behind her and lifting above shoulder level.  Most painful in back of shoulder.  Past Medical History:  Diagnosis Date  . Carpal tunnel syndrome   . Cataract   . DM (diabetes mellitus) type II controlled, neurological manifestation (HCC)    diet controlled, no meds  . Herpes zoster    chest area  . Hypothyroidism   . Peripheral autonomic neuropathy due to diabetes mellitus ([place])     . Ulnar neuropathy   . Vitamin D deficiency    Past Surgical History:  Procedure Laterality Date  . TONSILLECTOMY      reports that she has never smoked. She has never used smokeless tobacco. She reports that she drinks about 1.2 oz of alcohol per week . She reports that she does not use drugs.  Functional Status Survey:    Family History  Problem Relation Age of Onset  . Diabetes Father   . Depression Mother   . Heart disease Mother   . Stroke Mother   . Arthritis Mother   . Osteoporosis Mother   . Neuropathy Brother   . High blood pressure Brother   . Neuropathy Son   . Heart attack    . Colon cancer Neg Hx     Health Maintenance  Topic Date Due  . Hepatitis C Screening  25-Feb-1956  . PNEUMOCOCCAL POLYSACCHARIDE VACCINE (1) 04/29/1957  . OPHTHALMOLOGY EXAM  04/29/1965  . HIV Screening  04/29/1970  . URINE MICROALBUMIN  05/06/2016  . HEMOGLOBIN A1C  07/25/2016  . FOOT EXAM  09/23/2016  . MAMMOGRAM  01/10/2017  . PAP SMEAR  03/06/2017 (Originally 04/29/1976)  . TETANUS/TDAP  01/23/2026 (Originally 04/29/1974)  . INFLUENZA VACCINE  05/06/2017  . COLONOSCOPY  08/01/2020    Allergies  Allergen Reactions  . Codeine Nausea And Vomiting  . 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 Always use your most recent med list.          gabapentin 100 MG capsule Commonly known as:  NEURONTIN PATIENT UNSURE OF DOSE   METANX PO Take by mouth. 60 mg take one twice daily   OMEGA 3 PO Take 2 capsules by mouth daily.   Vitamin D (Ergocalciferol) 50000 units Caps capsule Commonly known as:  DRISDOL Take 50,000 Units by mouth 2 (two) times a week. For 90 days       Review of Systems  Constitutional: Negative for chills, fever, malaise/fatigue and weight loss.  HENT: Negative for hearing loss.   Eyes:  Negative for blurred vision.  Respiratory: Negative for cough and shortness of breath.   Cardiovascular: Negative for chest pain, palpitations and leg swelling.  Gastrointestinal: Negative for abdominal pain, blood in stool, constipation, diarrhea and melena.  Genitourinary: Negative for dysuria.  Musculoskeletal: Positive for joint pain. Negative for falls.  Skin: Negative for itching and rash.  Neurological: Positive for tingling and sensory change. Negative for weakness.  Psychiatric/Behavioral: Negative for depression and memory loss. The patient is not nervous/anxious.     Vitals:   01/15/17 1349  BP: 120/70  Pulse: 77  Temp: 97.8 F (36.6 C)  TempSrc: Oral  SpO2: 98%  Weight: 164 lb (74.4 kg)  Height: 5\' 7"  (1.702 m)   Body mass index is 25.69 kg/m. Physical Exam  Constitutional: She is oriented to person, place, and time. She appears well-developed and well-nourished. No distress.  HENT:  Head: Normocephalic and atraumatic.  Right Ear: External ear normal.  Left Ear: External ear normal.  Nose: Nose normal.  Mouth/Throat: Oropharynx is clear and moist. No oropharyngeal exudate.  Eyes: Conjunctivae and EOM are normal. Pupils are equal, round, and reactive to light.  glasses  Neck: Neck supple. No JVD present.  Cardiovascular: Normal rate, regular rhythm, normal heart sounds and intact distal pulses.   Pulmonary/Chest: Effort normal and breath sounds normal. Right breast exhibits no inverted nipple, no mass, no nipple discharge, no skin change and no tenderness. Left breast exhibits no inverted nipple, no mass, no nipple discharge, no skin change and no tenderness.  Abdominal: Soft. Bowel sounds are normal. She exhibits no distension and no mass. There is no tenderness. There is no rebound and no guarding. No hernia.  Genitourinary: Vagina normal and uterus normal. No vaginal discharge found.  Genitourinary Comments: Pap smear performed  Musculoskeletal: She exhibits  tenderness.  Left posterior shoulder, decreased abduction above 90 and difficulty with internal rotation of left arm  Lymphadenopathy:    She has no cervical adenopathy.  Neurological: She is alert and oriented to person, place, and time. She displays normal reflexes. A sensory deficit is present. No cranial nerve deficit.  Skin: Skin is warm and dry.  Psychiatric: She has a normal mood and affect.    Labs reviewed: Basic Metabolic Panel: No results for input(s): NA, K, CL, CO2, GLUCOSE, BUN, CREATININE, CALCIUM, MG, PHOS in the last 8760 hours. Liver Function Tests: No results for input(s): AST, ALT, ALKPHOS, BILITOT, PROT, ALBUMIN in the last 8760 hours. No results for input(s): LIPASE, AMYLASE in the last 8760 hours. No results for input(s): AMMONIA in the last 8760 hours. CBC: No results  for input(s): WBC, NEUTROABS, HGB, HCT, MCV, PLT in the last 8760 hours. Cardiac Enzymes: No results for input(s): CKTOTAL, CKMB, CKMBINDEX, TROPONINI in the last 8760 hours. BNP: Invalid input(s): POCBNP Lab Results  Component Value Date   HGBA1C 6.2 (H) 01/24/2016   Lab Results  Component Value Date   TSH 0.854 01/24/2016   Lab Results  Component Value Date   VITAMINB12 596 05/17/2015   Assessment/Plan 1. Controlled type 2 diabetes mellitus with diabetic polyneuropathy, without long-term current use of insulin ([place]) -work on diet and exercise - CBC with Differential/Platelet; Future - COMPLETE METABOLIC PANEL WITH GFR; Future - Hemoglobin A1c; Future - Lipid panel; Future - Microalbumin / creatinine urine ratio; Future  2. Hypothyroidism, unspecified type -cont current levothyroxine - TSH; Future  3. 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 Chronic pain of both knees - some new arthritis pains with walking, requests imaging: - DG Knee Complete 4  Views Right; Future - DG Knee Complete 4 Views Left; Future  5. Microscopic hematuria -to f/u with urology, check UA here at her request, no evidence of hematuria - Urinalysis, Routine w reflex microscopic; Future  6. Well female exam with routine gynecological exam - performed today and wnl - PAP, Image Guided [[facility], [facility]] was sent  Labs/tests ordered: Orders Placed This Encounter  Procedures  . DG Knee Complete 4 Views Right    Standing Status:   Future    Number of Occurrences:   1    Standing Expiration Date:   03/18/2018    Order Specific Question:   Reason for Exam (SYMPTOM  OR DIAGNOSIS REQUIRED)    Answer:   increased knee pain and crepitus    Order Specific Question:   Preferred imaging location?    Answer:   [facility]    Order Specific Question:   Call Results- Best Contact Number?    Answer:   [phone]  . DG Knee Complete 4 Views Left    Standing Status:   Future    Number of Occurrences:   1    Standing Expiration Date:   03/18/2018    Order Specific Question:   Reason for Exam (SYMPTOM  OR DIAGNOSIS REQUIRED)    Answer:   increased knee pain and crepitus    Order Specific Question:   Preferred imaging location?    Answer:   [facility]    Order Specific Question:   Call Results- Best Contact Number?    Answer:   [phone]  . MR Shoulder Left Wo Contrast    Epic order Wt. 165/ ht. 5'7/ no claus/ no metal in eyes/ no bullet or shrapnel/ no left shoulder sx/ no eyes, ears, brain or heart sx/ no implants/ no needs Ins- bcbs pda/pt     Standing Status:   Future    Standing Expiration Date:   03/17/2018    Order Specific Question:   Reason for Exam (SYMPTOM  OR DIAGNOSIS REQUIRED)    Answer:   possible rotator cuff tear, decreased internal rotation and abduction, pain posteriorly in right shoulder    Order Specific Question:   What is the patient's sedation requirement?    Answer:   No Sedation    Order Specific Question:   Does  the patient have a pacemaker or implanted devices?    Answer:   No  Order Specific Question:   Preferred imaging location?    Answer:   [facility] [facility] (table limit-550lbs)    Order Specific Question:   Radiology Contrast Protocol - do NOT remove file path    Answer:   [email or web address]  . CBC with Differential/Platelet    Standing Status:   Future    Number of Occurrences:   1    Standing Expiration Date:   04/16/2017  . COMPLETE METABOLIC PANEL WITH GFR    Standing Status:   Future    Number of Occurrences:   1    Standing Expiration Date:   04/16/2017  . Hemoglobin A1c    Standing Status:   Future    Number of Occurrences:   1    Standing Expiration Date:   04/16/2017  . TSH    Standing Status:   Future    Number of Occurrences:   1    Standing Expiration Date:   04/16/2017  . Lipid panel    Standing Status:   Future    Number of Occurrences:   1    Standing Expiration Date:   04/16/2017  . Microalbumin / creatinine urine ratio    Standing Status:   Future    Number of Occurrences:   1    Standing Expiration Date:   04/16/2017  . Urinalysis, Routine w reflex microscopic    Standing Status:   Future    Number of Occurrences:   1    Standing Expiration Date:   04/16/2017   f/u 1 year for cpe, labs before  Consuelo Suthers L. Jupiter Boys, D.O. [employer] [address], [place] [address] Cell Phone (Mon-Fri 8am-5pm):  [phone] On Call:  [phone] & follow prompts after 5pm & weekends Office Phone:  [phone] Office Fax:  [phone]

## 2017-01-16 ENCOUNTER — Other Ambulatory Visit: Payer: Federal, State, Local not specified - PPO

## 2017-01-19 ENCOUNTER — Other Ambulatory Visit: Payer: Self-pay

## 2017-01-19 ENCOUNTER — Ambulatory Visit
Admission: RE | Admit: 2017-01-19 | Discharge: 2017-01-19 | Disposition: A | Discharge status: Home / Self Care | Payer: Federal, State, Local not specified - PPO | Source: Ambulatory Visit | Attending: Internal Medicine | Admitting: Internal Medicine

## 2017-01-19 ENCOUNTER — Other Ambulatory Visit: Payer: Federal, State, Local not specified - PPO

## 2017-01-19 DIAGNOSIS — E039 Hypothyroidism, unspecified: Secondary | ICD-10-CM

## 2017-01-19 DIAGNOSIS — E1142 Type 2 diabetes mellitus with diabetic polyneuropathy: Secondary | ICD-10-CM

## 2017-01-19 DIAGNOSIS — R3129 Other microscopic hematuria: Secondary | ICD-10-CM

## 2017-01-19 DIAGNOSIS — M25562 Pain in left knee: Principal | ICD-10-CM

## 2017-01-19 DIAGNOSIS — M25561 Pain in right knee: Principal | ICD-10-CM

## 2017-01-19 DIAGNOSIS — G8929 Other chronic pain: Secondary | ICD-10-CM

## 2017-01-19 LAB — CBC WITH DIFFERENTIAL/PLATELET
Basophils Absolute: 49 cells/uL (ref 0–200)
Basophils Relative: 1 %
Eosinophils Absolute: 147 cells/uL (ref 15–500)
Eosinophils Relative: 3 %
HCT: 41.6 % (ref 35.0–45.0)
Hemoglobin: 13.5 g/dL (ref 11.7–15.5)
Lymphocytes Relative: 54 %
Lymphs Abs: 2646 cells/uL (ref 850–3900)
MCH: 29.2 pg (ref 27.0–33.0)
MCHC: 32.5 g/dL (ref 32.0–36.0)
MCV: 89.8 fL (ref 80.0–100.0)
MPV: 10 fL (ref 7.5–12.5)
Monocytes Absolute: 392 cells/uL (ref 200–950)
Monocytes Relative: 8 %
Neutro Abs: 1666 cells/uL (ref 1500–7800)
Neutrophils Relative %: 34 %
Platelets: 280 10*3/uL (ref 140–400)
RBC: 4.63 MIL/uL (ref 3.80–5.10)
RDW: 15.1 % — ABNORMAL HIGH (ref 11.0–15.0)
WBC: 4.9 10*3/uL (ref 3.8–10.8)

## 2017-01-19 LAB — LIPID PANEL
Cholesterol: 230 mg/dL — ABNORMAL HIGH (ref <200)
HDL: 79 mg/dL (ref >50)
LDL Cholesterol: 129 mg/dL — ABNORMAL HIGH (ref <100)
Total CHOL/HDL Ratio: 2.9 Ratio (ref <5.0)
Triglycerides: 108 mg/dL (ref <150)
VLDL: 22 mg/dL (ref <30)

## 2017-01-19 LAB — URINALYSIS, ROUTINE W REFLEX MICROSCOPIC
Bilirubin Urine: NEGATIVE
Glucose, UA: NEGATIVE
Ketones, ur: NEGATIVE
Leukocytes, UA: NEGATIVE
Nitrite: NEGATIVE
Protein, ur: NEGATIVE
Specific Gravity, Urine: 1.009 (ref 1.001–1.035)
pH: 6.5 (ref 5.0–8.0)

## 2017-01-19 LAB — COMPLETE METABOLIC PANEL WITH GFR
ALT: 19 U/L (ref 6–29)
AST: 21 U/L (ref 10–35)
Albumin: 4.3 g/dL (ref 3.6–5.1)
Alkaline Phosphatase: 48 U/L (ref 33–130)
BUN: 20 mg/dL (ref 7–25)
CO2: 25 mmol/L (ref 20–31)
Calcium: 10 mg/dL (ref 8.6–10.4)
Chloride: 106 mmol/L (ref 98–110)
Creat: 0.67 mg/dL (ref 0.50–0.99)
GFR, Est African American: >89 mL/min (ref >=60)
GFR, Est Non African American: >89 mL/min (ref >=60)
Glucose, Bld: 99 mg/dL (ref 65–99)
Potassium: 4.4 mmol/L (ref 3.5–5.3)
Sodium: 139 mmol/L (ref 135–146)
Total Bilirubin: 0.7 mg/dL (ref 0.2–1.2)
Total Protein: 7.1 g/dL (ref 6.1–8.1)

## 2017-01-19 LAB — URINALYSIS, MICROSCOPIC ONLY
Casts: NONE SEEN [LPF]
Crystals: NONE SEEN [HPF]
RBC / HPF: NONE SEEN RBC/HPF (ref <=2)
WBC, UA: NONE SEEN WBC/HPF (ref <=5)
Yeast: NONE SEEN [HPF]

## 2017-01-19 LAB — MICROALBUMIN / CREATININE URINE RATIO
Creatinine, Urine: 51 mg/dL (ref 20–320)
Microalb, Ur: <0.2 mg/dL

## 2017-01-19 LAB — TSH: TSH: 1.63 mIU/L

## 2017-01-20 ENCOUNTER — Encounter: Payer: Self-pay | Admitting: *Deleted

## 2017-01-20 LAB — PAP IG (IMAGE GUIDED)

## 2017-01-20 LAB — HEMOGLOBIN A1C
Hgb A1c MFr Bld: 5.7 % — ABNORMAL HIGH (ref <5.7)
Mean Plasma Glucose: 117 mg/dL

## 2017-01-28 ENCOUNTER — Other Ambulatory Visit: Payer: Federal, State, Local not specified - PPO

## 2017-02-11 ENCOUNTER — Other Ambulatory Visit: Payer: Federal, State, Local not specified - PPO

## 2017-03-10 ENCOUNTER — Encounter: Payer: Self-pay | Admitting: Internal Medicine

## 2017-03-16 NOTE — Telephone Encounter (Signed)
Opened in error

## 2017-07-17 ENCOUNTER — Ambulatory Visit: Payer: Federal, State, Local not specified - PPO | Admitting: Internal Medicine

## 2017-07-20 ENCOUNTER — Ambulatory Visit: Payer: Federal, State, Local not specified - PPO | Admitting: Internal Medicine

## 2018-06-04 ENCOUNTER — Telehealth: Payer: Self-pay | Admitting: *Deleted

## 2018-06-04 NOTE — Telephone Encounter (Signed)
Patient called and stated that she stepped on a nail. Patient is in [place]. Patient was calling to check her Immunization record on file. No Immunization for Td or Tdap noted. Patient will receive in [place].

## 2018-06-25 IMAGING — DX DG KNEE COMPLETE 4+V*R*
4 series · 4 of 4 positions shown · non-contrast
Comparison: None.

CLINICAL DATA: Chronic bilateral knee pain.  No known injury.

EXAM:
RIGHT KNEE - COMPLETE 4+ VIEW

[dg knee complete 4 views right (1 of 4)]
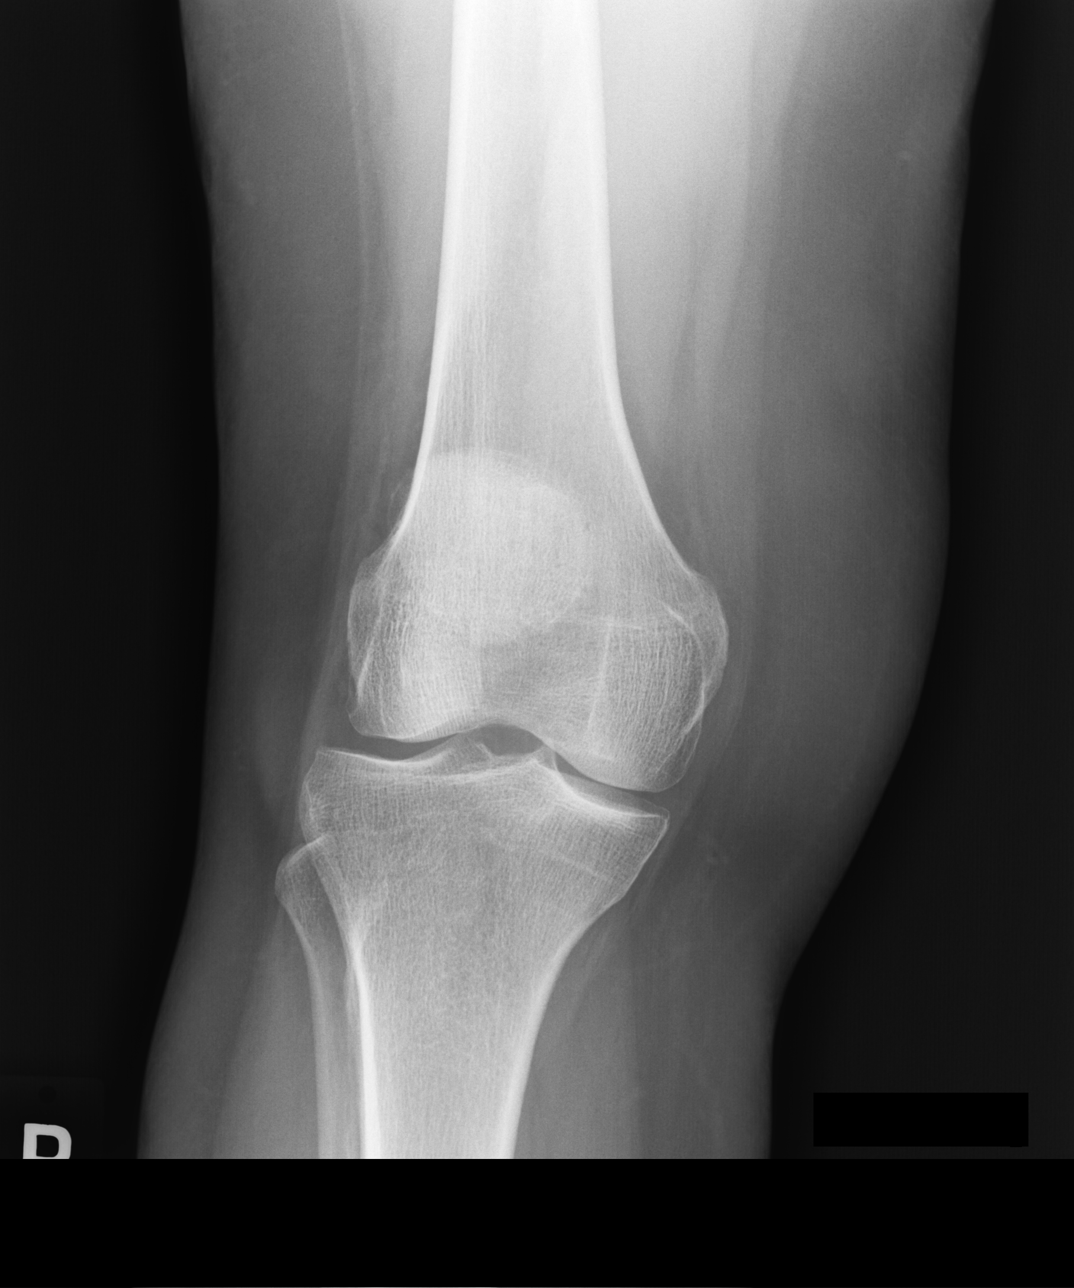

[dg knee complete 4 views right (2 of 4)]
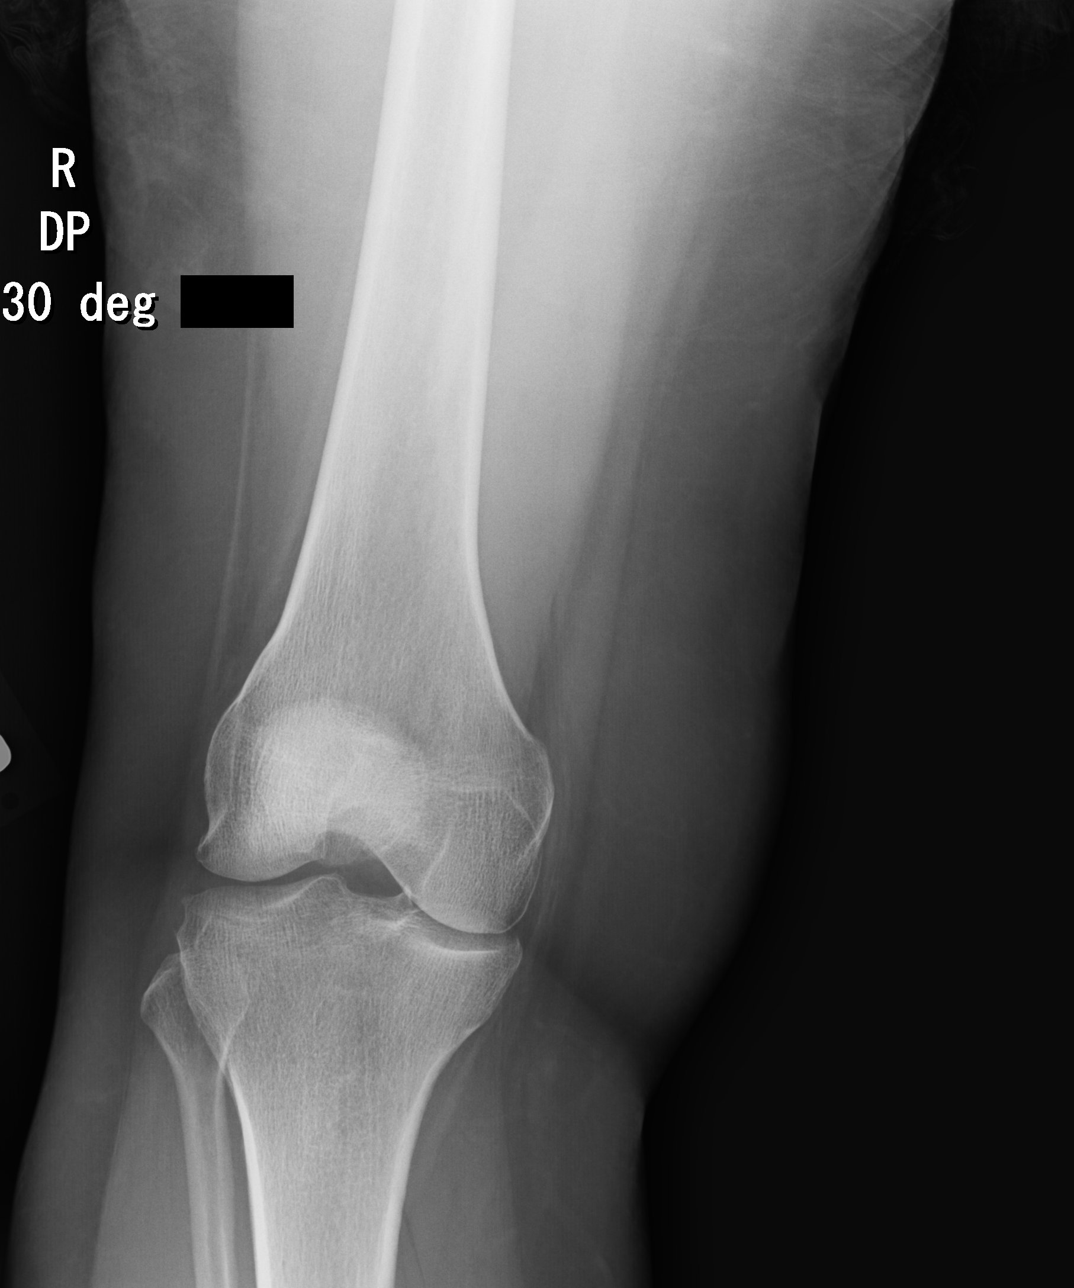

[dg knee complete 4 views right (3 of 4)]
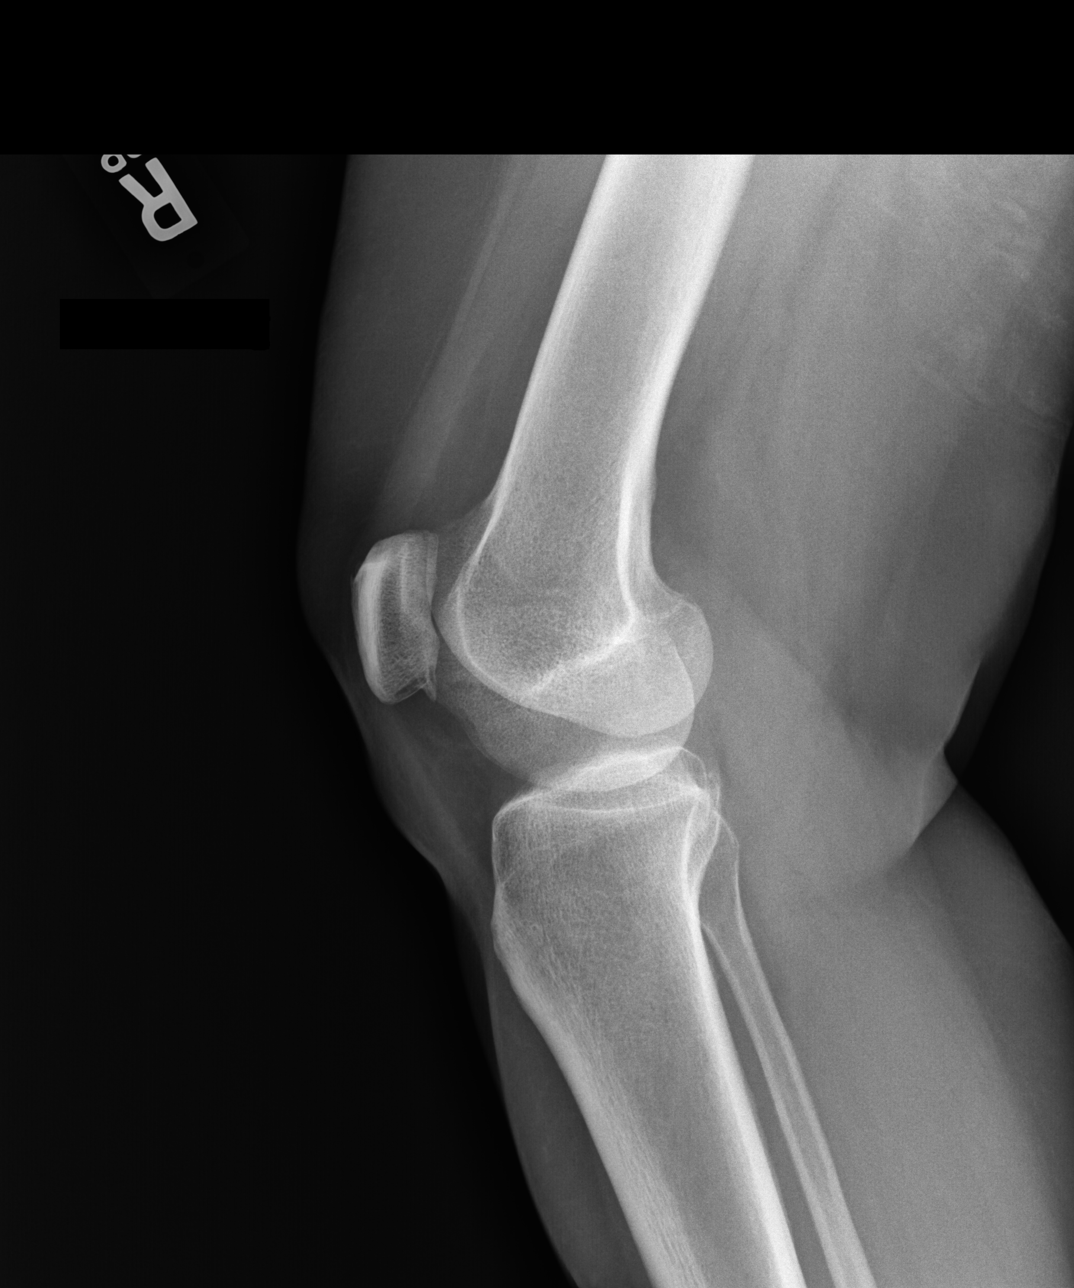

[dg knee complete 4 views right (4 of 4)]
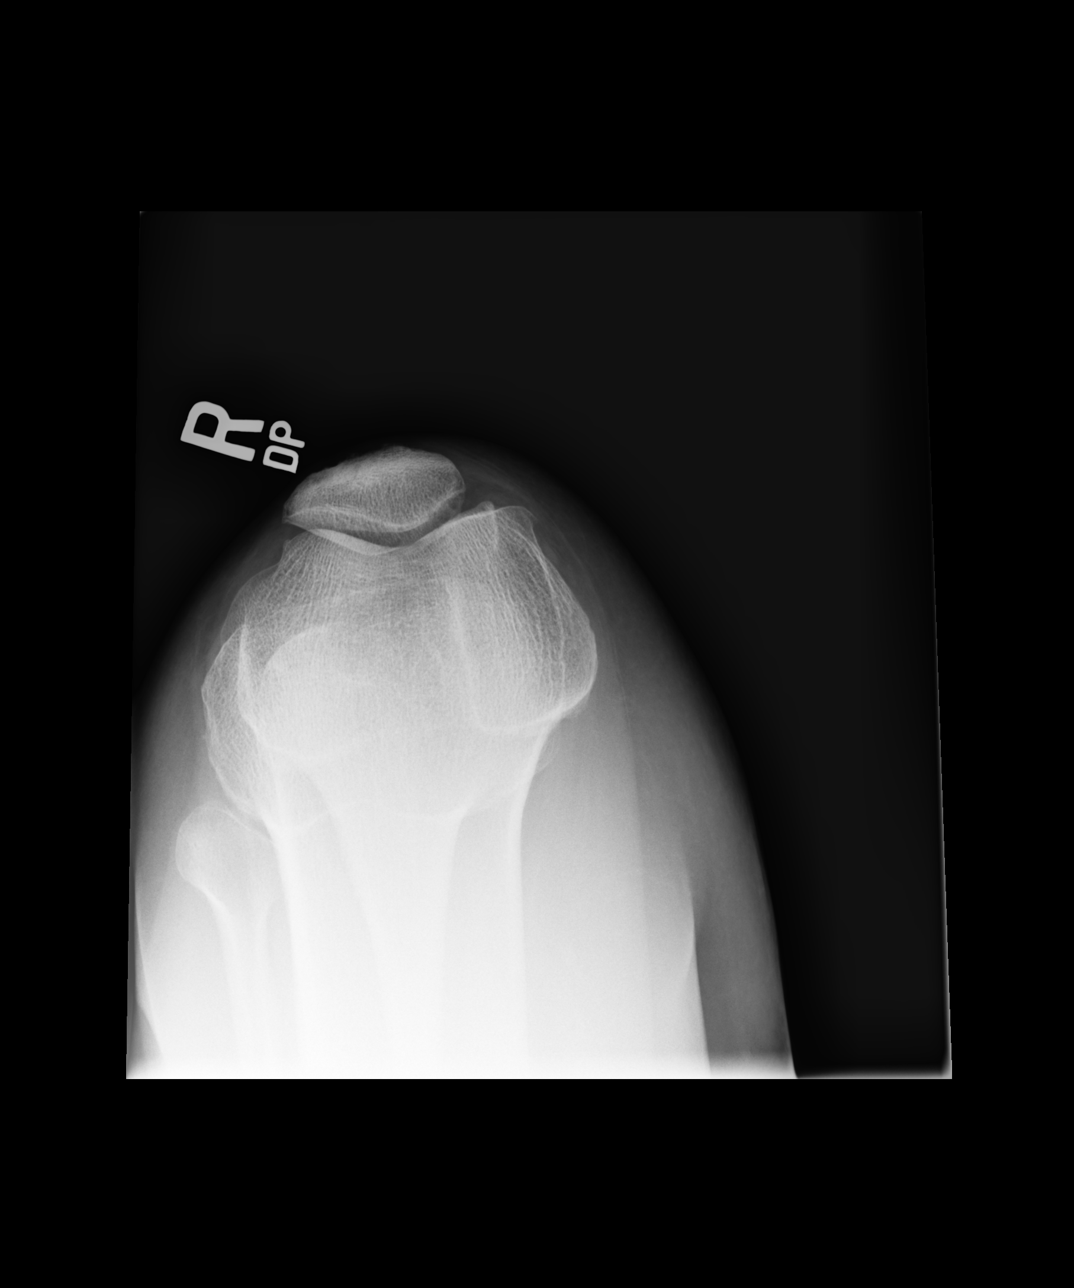

[4 of 4 positions shown; findings below may reference images not displayed]

FINDINGS: No evidence of fracture, dislocation, or joint effusion. Joint
spaces are preserved. Small osteophyte off the inferior pole the
patella is noted. Soft tissues are unremarkable.
IMPRESSION: Mild patellofemoral degenerative disease.  Otherwise negative.

## 2019-02-18 ENCOUNTER — Other Ambulatory Visit: Payer: Federal, State, Local not specified - PPO

## 2019-07-25 ENCOUNTER — Telehealth: Payer: Self-pay

## 2019-07-25 NOTE — Telephone Encounter (Signed)
Both pt and her mother need to come in for complete physical exams/appts before any labs can be drawn.  Please verify if either pt or mother have established with new PCPs?

## 2019-07-25 NOTE — Telephone Encounter (Signed)
The patient called and is asking for you to order a vitamin D level and a "full set" of labs.  She evidently is in [place] because she asked for labs to be drawn when you are here.  The last time she was seen in the office was in 2018.  Do you want to put in an order, or should I call her and tell her she needs an office appointment.    She wanted an INR appointment for her mother for tomorrow also, as she didn't move, but she does have a standing order.

## 2019-07-25 NOTE — Telephone Encounter (Signed)
Patient was called and recommendations per Dr. Mariea Clonts relayed to her.

## 2019-07-27 ENCOUNTER — Telehealth: Payer: Self-pay

## 2019-07-27 NOTE — Telephone Encounter (Signed)
Patient called to inquire if she could come here to have blood drawn with an order from another provider because she couldn't get an appointment sooner anywhere else I checked with Olivia Mackie and she said not unless her provider would take responsibility for the order I informed the patient of this

## 2020-07-25 ENCOUNTER — Encounter: Payer: Self-pay | Admitting: Gastroenterology

## 2020-08-10 ENCOUNTER — Encounter: Payer: Self-pay | Admitting: Gastroenterology

## 2020-09-03 ENCOUNTER — Ambulatory Visit: Payer: Self-pay | Admitting: Internal Medicine

## 2020-09-13 ENCOUNTER — Ambulatory Visit: Payer: Self-pay | Admitting: Internal Medicine

## 2020-10-11 ENCOUNTER — Ambulatory Visit: Payer: Self-pay | Admitting: Internal Medicine

## 2020-10-15 ENCOUNTER — Encounter: Payer: Self-pay | Admitting: Internal Medicine

## 2020-11-26 ENCOUNTER — Encounter: Payer: Self-pay | Admitting: Internal Medicine

## 2022-06-11 ENCOUNTER — Ambulatory Visit: Payer: Federal, State, Local not specified - PPO | Admitting: Family Medicine

## 2022-06-24 ENCOUNTER — Telehealth: Payer: Self-pay | Admitting: *Deleted

## 2022-06-24 ENCOUNTER — Encounter: Payer: Self-pay | Admitting: Adult Health

## 2022-06-24 ENCOUNTER — Ambulatory Visit: Payer: Federal, State, Local not specified - PPO | Admitting: Adult Health

## 2022-06-24 VITALS — BP 126/90 | Temp 97.5°F | Resp 18 | Ht 67.0 in | Wt 166.0 lb

## 2022-06-24 DIAGNOSIS — R519 Headache, unspecified: Secondary | ICD-10-CM

## 2022-06-24 DIAGNOSIS — R7303 Prediabetes: Secondary | ICD-10-CM | POA: Diagnosis not present

## 2022-06-24 DIAGNOSIS — Z13820 Encounter for screening for osteoporosis: Secondary | ICD-10-CM

## 2022-06-24 DIAGNOSIS — E782 Mixed hyperlipidemia: Secondary | ICD-10-CM | POA: Diagnosis not present

## 2022-06-24 DIAGNOSIS — E1143 Type 2 diabetes mellitus with diabetic autonomic (poly)neuropathy: Secondary | ICD-10-CM | POA: Diagnosis not present

## 2022-06-24 DIAGNOSIS — Z1231 Encounter for screening mammogram for malignant neoplasm of breast: Secondary | ICD-10-CM

## 2022-06-24 DIAGNOSIS — Z0000 Encounter for general adult medical examination without abnormal findings: Secondary | ICD-10-CM

## 2022-06-24 MED ORDER — METANX 3-90.314-2-35 MG PO CAPS: 1.0000 | ORAL_CAPSULE | Freq: Every day | ORAL | 6 refills | Status: AC

## 2022-06-24 NOTE — Patient Instructions (Signed)
Preventive Care 65 Years and Older, Female Preventive care refers to lifestyle choices and visits with your health care provider that can promote health and wellness. Preventive care visits are also called wellness exams. What can I expect for my preventive care visit? Counseling Your health care provider may ask you questions about your: Medical history, including: Past medical problems. Family medical history. Pregnancy and menstrual history. History of falls. Current health, including: Memory and ability to understand (cognition). Emotional well-being. Home life and relationship well-being. Sexual activity and sexual health. Lifestyle, including: Alcohol, nicotine or tobacco, and drug use. Access to firearms. Diet, exercise, and sleep habits. Work and work environment. Sunscreen use. Safety issues such as seatbelt and bike helmet use. Physical exam Your health care provider will check your: Height and weight. These may be used to calculate your BMI (body mass index). BMI is a measurement that tells if you are at a healthy weight. Waist circumference. This measures the distance around your waistline. This measurement also tells if you are at a healthy weight and may help predict your risk of certain diseases, such as type 2 diabetes and high blood pressure. Heart rate and blood pressure. Body temperature. Skin for abnormal spots. What immunizations do I need?  Vaccines are usually given at various ages, according to a schedule. Your health care provider will recommend vaccines for you based on your age, medical history, and lifestyle or other factors, such as travel or where you work. What tests do I need? Screening Your health care provider may recommend screening tests for certain conditions. This may include: Lipid and cholesterol levels. Hepatitis C test. Hepatitis B test. HIV (human immunodeficiency virus) test. STI (sexually transmitted infection) testing, if you are at  risk. Lung cancer screening. Colorectal cancer screening. Diabetes screening. This is done by checking your blood sugar (glucose) after you have not eaten for a while (fasting). Mammogram. Talk with your health care provider about how often you should have regular mammograms. BRCA-related cancer screening. This may be done if you have a family history of breast, ovarian, tubal, or peritoneal cancers. Bone density scan. This is done to screen for osteoporosis. Talk with your health care provider about your test results, treatment options, and if necessary, the need for more tests. Follow these instructions at home: Eating and drinking  Eat a diet that includes fresh fruits and vegetables, whole grains, lean protein, and low-fat dairy products. Limit your intake of foods with high amounts of sugar, saturated fats, and salt. Take vitamin and mineral supplements as recommended by your health care provider. Do not drink alcohol if your health care provider tells you not to drink. If you drink alcohol: Limit how much you have to 0-1 drink a day. Know how much alcohol is in your drink. In the [place] one drink equals one 12 oz bottle of beer (355 mL), one 5 oz glass of wine (148 mL), or one 1 oz glass of hard liquor (44 mL). Lifestyle Brush your teeth every morning and night with fluoride toothpaste. Floss one time each day. Exercise for at least 30 minutes 5 or more days each week. Do not use any products that contain nicotine or tobacco. These products include cigarettes, chewing tobacco, and vaping devices, such as e-cigarettes. If you need help quitting, ask your health care provider. Do not use drugs. If you are sexually active, practice safe sex. Use a condom or other form of protection in order to prevent STIs. Take aspirin only as told by   your health care provider. Make sure that you understand how much to take and what form to take. Work with your health care provider to find out whether it  is safe and beneficial for you to take aspirin daily. Ask your health care provider if you need to take a cholesterol-lowering medicine (statin). Find healthy ways to manage stress, such as: Meditation, yoga, or listening to music. Journaling. Talking to a trusted person. Spending time with friends and family. Minimize exposure to UV radiation to reduce your risk of skin cancer. Safety Always wear your seat belt while driving or riding in a vehicle. Do not drive: If you have been drinking alcohol. Do not ride with someone who has been drinking. When you are tired or distracted. While texting. If you have been using any mind-altering substances or drugs. Wear a helmet and other protective equipment during sports activities. If you have firearms in your house, make sure you follow all gun safety procedures. What's next? Visit your health care provider once a year for an annual wellness visit. Ask your health care provider how often you should have your eyes and teeth checked. Stay up to date on all vaccines. This information is not intended to replace advice given to you by your health care provider. Make sure you discuss any questions you have with your health care provider. Document Revised: 03/20/2021 Document Reviewed: 03/20/2021 Elsevier Patient Education  2023 Elsevier Inc.  

## 2022-06-24 NOTE — Telephone Encounter (Signed)
Patient called and stated that she was seen today and was told to call back with dosage of Gabapentin.   Stated that she takes Gabapentin '100mg'$  once daily As needed.   Stated that she does not take everyday.  Medication list updated.

## 2022-06-24 NOTE — Progress Notes (Signed)
[facility] clinic  Provider:   Durenda Age DNP  Code Status:  Full Code  Goals of Care:     06/24/2022    9:53 AM  Advanced Directives  Does Patient Have a Medical Advance Directive? No  Would patient like information on creating a medical advance directive? No - Patient declined     Chief Complaint  Patient presents with   Establish Care    Patient is here to establish care. Has medication bottles at initial appointment. NCIR verified.patient has an extended list of care gap metrics to cover    HPI: Patient is a 66 y.o. female seen today to re-establish care with [place]. She lives in [place] for 6 months and 6 months in [place]. She works for [employer]. She declined flu vaccine. She had consulted a neurologist in [place] for her headache. She was prescribed METANX and does not have anymore headache. She takes Gabapentin PRN for her neuropathy. She has numbness on bilateral feet toes for 5 years. Her father died due to diabetic coma and mother died of dementia.   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 60 mg take one twice daily   Omega-3 Fatty Acids (OMEGA 3 PO) Take 2 capsules by mouth daily.    Vitamin D, Ergocalciferol, (DRISDOL) 50000 UNITS CAPS capsule Take 50,000 Units  by mouth 2 (two) times a week. For 90 days   [DISCONTINUED] L-Methylfolate-B6-B12 (METANX PO) Take by mouth. 60 mg take one twice daily   No facility-administered encounter medications on file as of 06/24/2022.    Review of Systems:  Review of Systems  Constitutional:  Negative for appetite change, chills, fatigue and fever.  HENT:  Negative for congestion, hearing loss, rhinorrhea and sore throat.   Eyes: Negative.   Respiratory:  Negative for cough, shortness of breath and wheezing.   Cardiovascular:  Negative for chest pain, palpitations and leg swelling.  Gastrointestinal:  Negative for abdominal pain, constipation, diarrhea, nausea and vomiting.  Genitourinary:  Negative for dysuria.  Musculoskeletal:  Negative for arthralgias, back pain and myalgias.  Skin:  Negative for color change, rash and wound.  Neurological:  Positive for numbness. Negative for dizziness, weakness and headaches.       Bilateral feet toes  Psychiatric/Behavioral:  Negative for behavioral problems. The patient is not nervous/anxious.     Health Maintenance  Topic Date Due   COVID-19 Vaccine (1) Never done   OPHTHALMOLOGY EXAM  Never done   Hepatitis C Screening  Never done   Zoster Vaccines- Shingrix (1 of 2) Never done   MAMMOGRAM  01/10/2017   HEMOGLOBIN A1C  07/21/2017   FOOT EXAM  01/15/2018   Diabetic kidney  evaluation - GFR measurement  01/19/2018   Diabetic kidney evaluation - Urine ACR  01/19/2018   Pneumonia Vaccine 64+ Years old (1 - PCV) Never done   DEXA SCAN  Never done   COLONOSCOPY (Pts 45-37yr Insurance coverage will need to be confirmed)  08/01/2020   INFLUENZA VACCINE  Never done   TETANUS/TDAP  06/04/2028   HPV VACCINES  Aged Out    Physical Exam: Vitals:   06/24/22 1025  BP: (!) 126/90  Resp: 18  Temp: (!) 97.5 F (36.4 C)  SpO2: 99%  Weight: 166 lb (75.3 kg)  Height: '5\' 7"'$  (1.702 m)   Body mass index is 26 kg/m. Physical Exam Constitutional:      Appearance:  Normal appearance.  HENT:     Head: Normocephalic and atraumatic.     Nose: Nose normal.     Mouth/Throat:     Mouth: Mucous membranes are moist.  Eyes:     Conjunctiva/sclera: Conjunctivae normal.  Cardiovascular:     Rate and Rhythm: Normal rate and regular rhythm.  Pulmonary:     Effort: Pulmonary effort is normal.     Breath sounds: Normal breath sounds.  Abdominal:     General: Bowel sounds are normal.     Palpations: Abdomen is soft.  Musculoskeletal:        General: Normal range of motion.     Cervical back: Normal range of motion.  Skin:    General: Skin is warm and dry.  Neurological:     General: No focal deficit present.     Mental Status: She is alert and oriented to person, place, and time.  Psychiatric:        Mood and Affect: Mood normal.        Behavior: Behavior normal.        Thought Content: Thought content normal.        Judgment: Judgment normal.     Labs reviewed: Basic Metabolic Panel: No results for input(s): "NA", "K", "CL", "CO2", "GLUCOSE", "BUN", "CREATININE", "CALCIUM", "MG", "PHOS", "TSH" in the last 8760 hours. Liver Function Tests: No results for input(s): "AST", "ALT", "ALKPHOS", "BILITOT", "PROT", "ALBUMIN" in the last 8760 hours. No results for input(s): "LIPASE", "AMYLASE" in the last 8760 hours. No results for input(s): "AMMONIA" in the last 8760 hours. CBC: No results for input(s): "WBC", "NEUTROABS", "HGB", "HCT", "MCV", "PLT" in the last 8760 hours. Lipid Panel: No results for input(s): "CHOL", "HDL", "LDLCALC", "TRIG", "CHOLHDL", "LDLDIRECT" in the last 8760 hours. Lab Results  Component Value Date   HGBA1C 5.7 (H) 01/19/2017    Procedures since last visit: No results found.  Assessment/Plan  1. Peripheral autonomic neuropathy due to diabetes mellitus ([place] -  has bilateral feet toes numb X 5 years -  continue PRN Neurontin  2. Osteoporosis screening - DG Bone Density  3. Mixed hyperlipidemia -  continue Omega-3  fatty acids - Lipid panel  4. Prediabetes -  bilateral feet toes numb X 5 years - Hemoglobin A1C  5. Encounter for preventive care - Hepatitis C Antibody - Hep B Surface Antibody - Ambulatory referral to Gastroenterology - RPR - HIV antibody (with reflex)  6. Encounter for screening mammogram for malignant neoplasm of breast - MM Digital Diagnostic Bilat  7. Nonintractable headache, unspecified chronicity pattern, unspecified headache type -  continue METANX   Labs/tests ordered:  hepatitis C and B antibody, RPR, A1c, lipid panel, bone density, mammogram, HIV test  Next appt:  as needed/3 months

## 2022-06-25 LAB — LIPID PANEL
Cholesterol: 238 mg/dL — ABNORMAL HIGH (ref <200)
HDL: 76 mg/dL (ref >=50)
LDL Cholesterol (Calc): 145 mg/dL (calc) — ABNORMAL HIGH
Non-HDL Cholesterol (Calc): 162 mg/dL (calc) — ABNORMAL HIGH (ref <130)
Total CHOL/HDL Ratio: 3.1 (calc) (ref <5.0)
Triglycerides: 70 mg/dL (ref <150)

## 2022-06-25 LAB — HEPATITIS C ANTIBODY: Hepatitis C Ab: NONREACTIVE

## 2022-06-25 LAB — HEPATITIS B SURFACE ANTIBODY,QUALITATIVE: Hep B S Ab: NONREACTIVE

## 2022-06-25 LAB — HEMOGLOBIN A1C
Hgb A1c MFr Bld: 5.9 % of total Hgb — ABNORMAL HIGH (ref <5.7)
Mean Plasma Glucose: 123 mg/dL
eAG (mmol/L): 6.8 mmol/L

## 2022-06-25 NOTE — Progress Notes (Signed)
-    cholesterol 238, elevated (normal <200), LDL 145, elevated (normal <100) -  lifestyle modifications such as regular exercise, mediterranean diet(high fruits and vegetables, whole grains, beans, nuts and seeds,can be re-checked in 3 months to assess if needed to start on statin -  A1c 5.9, considered pre-diabetes per LAB guidelines -  Hep B and C negative

## 2022-06-26 NOTE — Telephone Encounter (Signed)
Medina-Vargas, Monina C, NP  You 19 hours ago (3:16 PM)    Noted

## 2022-07-30 ENCOUNTER — Telehealth: Payer: Self-pay | Admitting: *Deleted

## 2022-07-30 NOTE — Telephone Encounter (Signed)
Patient needs a Dr's note. She is sending Harris a fax of how her company wants the note to read.

## 2022-07-31 ENCOUNTER — Telehealth: Payer: Self-pay

## 2022-07-31 NOTE — Telephone Encounter (Addendum)
Patient called requesting letter for her job concerning her disability. Patient gave me the information that needs to be addressed in the ltter. I have typed letter pended it and sent to Durenda Age, NP   Routed to Durenda Age, NP

## 2022-07-31 NOTE — Telephone Encounter (Signed)
Patient called to advise that she is not able to send request via fax however she plans to call back in about 30 minutes to give [place] the details of how the note should read.  Awaiting return call

## 2022-08-03 NOTE — Telephone Encounter (Signed)
Letter signed

## 2022-09-22 ENCOUNTER — Encounter: Payer: Federal, State, Local not specified - PPO | Admitting: Adult Health

## 2022-09-22 NOTE — Progress Notes (Signed)
This encounter was created in error - please disregard.

## 2022-10-21 ENCOUNTER — Encounter: Payer: Self-pay | Admitting: Gastroenterology

## 2023-05-15 ENCOUNTER — Encounter: Payer: Self-pay | Admitting: Family Medicine

## 2023-05-18 ENCOUNTER — Encounter: Payer: Self-pay | Admitting: Family Medicine

## 2023-07-16 ENCOUNTER — Telehealth: Payer: Federal, State, Local not specified - PPO

## 2023-07-16 NOTE — Telephone Encounter (Signed)
Patient states she needs an accomodation letter for work, see letter composed on 08/01/2022. Patient is aware she has not been seen x 12 months or longer. Patient is in [place] and said a video visit would be her only option at this point. Patient would like letter mailed to her.   [address] [place], [place] [address]

## 2023-07-17 NOTE — Telephone Encounter (Addendum)
Per Monina:  Medina-Vargas, Monina C, NP  You; Psc Clinical Pool2 minutes ago (12:00 PM)    Pls schedule her for a video visit.  Thanks.  Monina  Spoke with patient and scheduled a video visit with Dinah for next Tuesday.

## 2023-07-21 ENCOUNTER — Encounter: Payer: Self-pay | Admitting: Family

## 2023-07-21 ENCOUNTER — Telehealth (INDEPENDENT_AMBULATORY_CARE_PROVIDER_SITE_OTHER): Payer: Federal, State, Local not specified - PPO | Admitting: Family

## 2023-07-21 DIAGNOSIS — E1143 Type 2 diabetes mellitus with diabetic autonomic (poly)neuropathy: Secondary | ICD-10-CM | POA: Diagnosis not present

## 2023-07-21 NOTE — Progress Notes (Signed)
Provider: Richarda Blade FNP-C  Medina-Vargas, Margit Banda, NP  [name], [name] [name], [name], [name]  Extended Emergency Contact Information Primary Emergency Contact: Caitlin Hunt Phone: [phone] Relation: Son  Code Status:  Full Code Goals of care: Advanced Directive information    06/24/2022    9:53 AM  Advanced Directives  Does Patient Have a Medical Advance Directive? No  Would patient like information on creating a medical advance directive? No - Patient declined     Chief Complaint  Patient presents with   Acute Visit    Patient is requesting a doctors note    HPI:  Pt is a 67 y.o. female seen today for an acute visit for evaluation of Peripheral Neuropathy.States has a medical history of type 2 Diabetes mellitus.she request a  letter to be renewed to continue to do Telework 5 days a week for the next 12 months since Peripheral Neuropathy limits her travel from and to work and home.states has ability to perform her work at home without Peripheral Neuropathy affecting her work.   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   L-Methylfolate-Algae-B12-B6 (METANX) 3-90.314-2-35 MG CAPS Take 1 capsule by mouth daily at 12 noon. 60 mg  take one twice daily   Omega-3 Fatty Acids (OMEGA 3 PO) Take 2 capsules by mouth daily.    Vitamin D, Ergocalciferol, (DRISDOL) 50000 UNITS CAPS capsule Take 50,000 Units by mouth 2 (two) times a week. For 90 days   No facility-administered encounter medications on file as of 07/21/2023.    Review of Systems  Constitutional:  Negative for appetite change, chills, fatigue and fever.  Eyes:  Negative for pain, discharge, redness, itching and visual disturbance.  Respiratory:  Negative for cough, chest tightness, shortness of breath and wheezing.   Cardiovascular:  Negative for chest pain, palpitations and leg swelling.  Gastrointestinal:  Negative for abdominal distention, abdominal pain, diarrhea, nausea and vomiting.  Endocrine: Negative for cold intolerance, heat intolerance, polydipsia, polyphagia and polyuria.  Musculoskeletal:  Negative for arthralgias, back pain, gait problem, joint swelling, myalgias, neck pain and neck stiffness.  Skin:  Negative for color change, pallor, rash and wound.  Neurological:  Positive for numbness. Negative for dizziness, syncope, speech difficulty, weakness, light-headedness and headaches.    Immunization History  Administered Date(s) Administered   Td 06/04/2018   Pertinent  Health Maintenance Due  Topic Date Due   OPHTHALMOLOGY EXAM  Never done   MAMMOGRAM  01/10/2017   FOOT EXAM  01/15/2018   DEXA SCAN  Never done   Colonoscopy  08/01/2020   HEMOGLOBIN A1C  12/23/2022   INFLUENZA VACCINE  Never done      05/07/2015  9:08 AM 01/24/2016    3:41 PM 01/15/2017    1:54 PM 06/24/2022    9:54 AM 07/21/2023    1:48 PM  Fall Risk  Falls in the past year? No No No 1 0  Was there an injury with Fall?    0 0  Fall Risk Category Calculator    1 0  Fall Risk Category (Retired)    Low   (RETIRED) Patient Fall Risk Level    Low fall risk   Patient at Risk for Falls Due to    No Fall Risks No Fall Risks  Fall risk Follow up    Falls evaluation  completed Falls evaluation completed   Functional Status Survey:    There were no vitals filed for this visit. There is no height or weight on file to calculate BMI. Physical Exam Constitutional:      General: She is not in acute distress.    Appearance: Normal appearance. She is not ill-appearing.  Pulmonary:     Effort: Pulmonary effort is normal. No respiratory distress.  Neurological:     Mental Status: She is alert and oriented to person, place, and time.     Gait: Gait normal.  Psychiatric:        Mood and Affect: Mood normal.        Behavior: Behavior normal.     Labs reviewed: No results for input(s): "NA", "K", "CL", "CO2", "GLUCOSE", "BUN", "CREATININE", "CALCIUM", "MG", "PHOS" in the last 8760 hours. No results for input(s): "AST", "ALT", "ALKPHOS", "BILITOT", "PROT", "ALBUMIN" in the last 8760 hours. No results for input(s): "WBC", "NEUTROABS", "HGB", "HCT", "MCV", "PLT" in the last 8760 hours. Lab Results  Component Value Date   TSH 1.63 01/19/2017   Lab Results  Component Value Date   HGBA1C 5.9 (H) 06/24/2022   Lab Results  Component Value Date   CHOL 238 (H) 06/24/2022   HDL 76 06/24/2022   LDLCALC 145 (H) 06/24/2022   TRIG 70 06/24/2022   CHOLHDL 3.1 06/24/2022    Significant Diagnostic Results in last 30 days:  No results found.  Assessment/Plan  Peripheral autonomic neuropathy due to diabetes mellitus [place] Hunt) request a  letter to be renewed to continue to do Telework 5 days a week for the next 12 months since Peripheral Neuropathy limits her travel from and to work and home.states has ability to perform her work at home without Peripheral Neuropathy affecting her work. Letter written to be emailed to patient as requested.   Family/ staff Communication: Reviewed plan of care with patient verbalized understanding   Labs/tests ordered: None   Next Appointment: Has up coming appointment  I connected with  Caitlin Hunt on 07/21/23 by a video  enabled telemedicine application and verified that I am speaking with the correct person using two identifiers.   I discussed the limitations of evaluation and management by telemedicine. The patient expressed understanding and agreed to proceed.  Spent 30 minutes of face to face with patient  >50% time spent counseling; reviewing medical record; labs; and developing future plan of care.   Caesar Bookman, NP

## 2023-07-29 ENCOUNTER — Telehealth: Payer: Self-pay

## 2023-07-29 NOTE — Telephone Encounter (Signed)
Patient's letter also available on Mychart may resend letter.

## 2023-07-29 NOTE — Telephone Encounter (Signed)
Patient states she has be calling in regards to a work note being sent to her. Patient states she was seen by Mychart video visit on 07/21/2023

## 2023-07-30 NOTE — Telephone Encounter (Signed)
Patient note was mail to her. Patient has been notified

## 2023-09-07 ENCOUNTER — Telehealth: Payer: Federal, State, Local not specified - PPO | Admitting: Family

## 2023-09-07 NOTE — Telephone Encounter (Signed)
Letter was mailed by Ivor Messier.CMA on 07/29/2023 please verify which address the letter was sent. May resend letter.

## 2023-09-07 NOTE — Telephone Encounter (Signed)
Patient called to say she still needs a letter for work regarding her condition. She has not received it yet.

## 2023-09-08 ENCOUNTER — Encounter: Payer: Self-pay | Admitting: Family

## 2023-09-08 ENCOUNTER — Telehealth: Payer: Self-pay | Admitting: Family

## 2023-09-08 ENCOUNTER — Telehealth: Payer: Self-pay | Admitting: Adult Health

## 2023-09-08 NOTE — Telephone Encounter (Signed)
----- Forwarded message --------- From: Joni Fears, [email or web address]> Date: Thu, Aug 27, 2023, 3:11?PM Subject: Medical form needs more information To: Kash [email or web address]>  From [employer] headquarters: Reasonable Accomodations Branch: An initial review of the medical documentation you submitted in support of your reasonable accommodation request has been completed and additional information will be needed.  This email is a request for additional medical documentation in support of your RA request.  The Reasonable Accommodation Branch (RAB) is in place to determine eligibility based on the employee's medical documentation and to ensure that the regulations, policies, and laws are followed as it relates to providing reasonable accommodations in accordance with the Rehab Act of  1973/Americans with Disabilities Act Amendments Act of 2008 (ADAAA), HUD's Reasonable  Accommodations Handbook Policy, 7855.1, and associated union supplements. We also coordinate the RA process and provide information as needed. Your Editor, commissioning) role in the process is to serve as the decision maker in determining and implementing the most effective accommodation.  To record this as a reasonable accommodation (RA) there needs to be a HUD 1000 form and  supporting medical documentation.  The medical documentation you submitted to the Reasonable Accommodations [place] [employer] is insufficient to make an eligibility determination concerning your case as it does not show that you have a disability as defined by the Rehab Act of 1973/Americans with Disabilities Act Amendments Act of 2008 (ADAAA).  Therefore, please have your physician respond to the questions below.  ---_---------_-------------------------  The medical documentation must be on letterhead and signed by a physician and must include, at minimum:     Diagnosis:  What is the "Diagnosis" of medical condition/impairment?  Answer: Patient has  a permanent  disabilty as defined by Rehab Act of 1973/ ADA Act of 2008 ADAAA).  Specifically, Type II Diabetes Mellitus accompanied with peripheral neuropathy pain that limits her personal and physical mobility   2. Prognosis should consist of:  a. Please explain in detail the nature of the medical condition? b. Please explain in detail the severity of the medical condition?  a. Permanent disability, see 1 above.   b. The severity of nerve pain can be crippling when walking or standing or sitting for hours at a time.   c. What is the duration of the medical condition? (How long has the employee been dealing with his/her medical condition(s)?  Answer: I'm told since 2018.   d. If the medical condition is "permanent, temporary, chronic, lifelong and/or on-  going? Answer: Permanent medical  condition.  3. What affected major life activities are associated with medical condition(s) (functional limitations - such as walking, talking, hearing, memory, sitting, standing, concentrating etc.)?    Answer: walking, sitting and standing are affected.   4. Impact of the medical condition on performance of the job: How does the medical condition impact the patient's ability to perform the essential functions of his/her job duties?  Answer: Patient can perform the essential functions of her job with a reasonable accomodations to telework 5 days a week.  a.  What specific job tasks are problematic because of these limitations?  Answer:Walking, sitting or standing en route to work or at work may be problematic.  b.  Are there any accommodations that could reduce or eliminate the impact of the medical condition on the patient's ability to perform the essential functions of his/her job duties. Answer:  Patient has been teleworking 5 days a week for five years and I recommend she should be allowed to continue to do so.  5. Requested accommodation: (How will the requested accommodation help the employee  perform their job, apply for the job, or enjoy a benefit of the workplace? Answer: Employee / patient will be able to complete her job functions/ tasks without impacting her health or disability.  a.  Are there alternative accommodations that could benefit this employee, not  previously mentioned? Answer: None other than remote telework five days a week   b.  How often can the employee come into the workplace?  Once a week, twice a week, once a month? Answer:  Twice a year if able to do so.  c.  If the employee cannot come into the office at any time, what are the barriers  and/or restrictions that prevent the employee from coming into the office (i.e., distance or  time spent traveling)? Answer: [place] before she developed mobility related complications due to neuropathy.  Today time spent traveling would cause a direct   adverse impact to her health.  d.  Is the employee able to leave their home for their home for other vocational needs (i.e., doctor's appointments, grocery store, vacation), if so, how are they accommodated?  Answer: Patient indicates she has not been on vacation in over 10 years, doctor appointments include a private driver and she relies on others to do grocery shopping   It  depends on the flare up of the neuropathy and the pain associated with it. Employee does not drive and would need to rely on a private driver.  Please provide me with the requested medical documentation no later than (NLT) September 01, 2023.  Failure to provide the requested documentation may result in a determination that you are ineligible for reasonable accommodation.  Alternatively, you may complete the attached        [place] [address] [place], [facility] Office of [employer],  Actuary. Dept. of Housing and [employer]  [address]  [place], [place]  Work: [phone] Mobile: [phone] Email  [email or web address]  Check out our new website!  www.HUD.gov        Fair Housing Research officer, political party             WARNING: This email originated outside of [employer]. Even if this looks like a [employer], it is not. Do not provide your username, password, or any other personal information in response to this or any other email. [facility] will never ask you for your username or password via email. DO NOT CLICK links or attachments unless you are positive the content is safe. If in doubt about the safety of this message, select the [employer] Phishing button, which forwards to IT Security.

## 2023-09-08 NOTE — Telephone Encounter (Signed)
Letter completed please send as requested by patient.

## 2023-09-08 NOTE — Telephone Encounter (Signed)
Okay 

## 2023-09-11 NOTE — Telephone Encounter (Signed)
Patient reported she reviewed letter via MyChart.
